# Patient Record
Sex: Male | Born: 1995
Health system: Southern US, Community
[De-identification: ages and names within clinical notes are randomized; demographics above are authoritative.]

## PROBLEM LIST (undated history)

## (undated) ENCOUNTER — Emergency Department: Discharge status: Absent without leave | Payer: Self-pay

## (undated) DIAGNOSIS — L600 Ingrowing nail: Secondary | ICD-10-CM

## (undated) DIAGNOSIS — F329 Major depressive disorder, single episode, unspecified: Secondary | ICD-10-CM

## (undated) DIAGNOSIS — F32A Depression, unspecified: Secondary | ICD-10-CM

## (undated) HISTORY — DX: Depression, unspecified: F32.A

## (undated) HISTORY — DX: Major depressive disorder, single episode, unspecified: F32.9

---
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
## 2014-05-20 ENCOUNTER — Ambulatory Visit (INDEPENDENT_AMBULATORY_CARE_PROVIDER_SITE_OTHER): Payer: 59 | Admitting: Internal Medicine

## 2014-05-20 ENCOUNTER — Encounter: Payer: Self-pay | Admitting: Internal Medicine

## 2014-05-20 VITALS — BP 100/70 | HR 96 | Temp 98.0°F | Resp 18 | Ht 63.0 in | Wt 127.0 lb

## 2014-05-20 DIAGNOSIS — L237 Allergic contact dermatitis due to plants, except food: Secondary | ICD-10-CM

## 2014-05-20 DIAGNOSIS — L255 Unspecified contact dermatitis due to plants, except food: Secondary | ICD-10-CM

## 2014-05-20 MED ORDER — PREDNISONE 10 MG PO TABS: ORAL_TABLET | ORAL | Status: DC

## 2014-05-20 MED ORDER — TRIAMCINOLONE ACETONIDE 0.1 % EX CREA: 1.0000 "application " | TOPICAL_CREAM | Freq: Two times a day (BID) | CUTANEOUS | Status: DC

## 2014-05-20 NOTE — Progress Notes (Signed)
   Subjective:    Patient ID: Scott Zimmerman, male    DOB: 07/22/1996, 18 y.o.   MRN: [id number]  HPI  18 year old patient who presents with a one-week history of a very pruritic rash involving primarily the extremities due to a poison ivy/contact dermatitis.  History reviewed. No pertinent past medical history.  History   Social History  . Marital Status: Single    Spouse Name: N/A    Number of Children: N/A  . Years of Education: N/A   Occupational History  . Not on file.   Social History Main Topics  . Smoking status: Never Smoker   . Smokeless tobacco: Never Used  . Alcohol Use: 2.0 oz/week    4 drink(s) per week  . Drug Use: No  . Sexual Activity: Not on file   Other Topics Concern  . Not on file   Social History Narrative  . No narrative on file    History reviewed. No pertinent past surgical history.  No family history on file.  No Known Allergies  No current outpatient prescriptions on file prior to visit.   No current facility-administered medications on file prior to visit.    BP 100/70  Pulse 96  Temp(Src) 98 F (36.7 C) (Oral)  Resp 18  Ht 5\' 3"  (1.6 m)  Wt 127 lb (57.607 kg)  BMI 22.50 kg/m2  SpO2 97%     Review of Systems  Constitutional: Negative for fever, chills, appetite change and fatigue.  HENT: Negative for congestion, dental problem, ear pain, hearing loss, sore throat, tinnitus, trouble swallowing and voice change.   Eyes: Negative for pain, discharge and visual disturbance.  Respiratory: Negative for cough, chest tightness, wheezing and stridor.   Cardiovascular: Negative for chest pain, palpitations and leg swelling.  Gastrointestinal: Negative for nausea, vomiting, abdominal pain, diarrhea, constipation, blood in stool and abdominal distention.  Genitourinary: Negative for urgency, hematuria, flank pain, discharge, difficulty urinating and genital sores.  Musculoskeletal: Negative for arthralgias, back pain, gait problem,  joint swelling, myalgias and neck stiffness.  Skin: Positive for rash.  Neurological: Negative for dizziness, syncope, speech difficulty, weakness, numbness and headaches.  Hematological: Negative for adenopathy. Does not bruise/bleed easily.  Psychiatric/Behavioral: Negative for behavioral problems and dysphoric mood. The patient is not nervous/anxious.        Objective:   Physical Exam  Constitutional: He appears well-developed and well-nourished. No distress.  Skin:  Scattered dry, erythematous, patchy rash over primarily inner aspects of the arms, but also affecting the right axilla and lower extremities          Assessment & Plan:   Poison ivy dermatitis.  Will treat with topical and oral steroids  We'll call if unimproved

## 2014-05-20 NOTE — Progress Notes (Signed)
Pre visit review using our clinic review tool, if applicable. No additional management support is needed unless otherwise documented below in the visit note. 

## 2014-05-20 NOTE — Patient Instructions (Signed)
       Poison Ivy Poison ivy is a inflammation of the skin (contact dermatitis) caused by touching the allergens on the leaves of the ivy plant following previous exposure to the plant. The rash usually appears 48 hours after exposure. The rash is usually bumps (papules) or blisters (vesicles) in a linear pattern. Depending on your own sensitivity, the rash may simply cause redness and itching, or it may also progress to blisters which may break open. These must be well cared for to prevent secondary bacterial (germ) infection, followed by scarring. Keep any open areas dry, clean, dressed, and covered with an antibacterial ointment if needed. The eyes may also get puffy. The puffiness is worst in the morning and gets better as the day progresses. This dermatitis usually heals without scarring, within 2 to 3 weeks without treatment. HOME CARE INSTRUCTIONS  Thoroughly wash with soap and water as soon as you have been exposed to poison ivy. You have about one half hour to remove the plant resin before it will cause the rash. This washing will destroy the oil or antigen on the skin that is causing, or will cause, the rash. Be sure to wash under your fingernails as any plant resin there will continue to spread the rash. Do not rub skin vigorously when washing affected area. Poison ivy cannot spread if no oil from the plant remains on your body. A rash that has progressed to weeping sores will not spread the rash unless you have not washed thoroughly. It is also important to wash any clothes you have been wearing as these may carry active allergens. The rash will return if you wear the unwashed clothing, even several days later. Avoidance of the plant in the future is the best measure. Poison ivy plant can be recognized by the number of leaves. Generally, poison ivy has three leaves with flowering branches on a single stem. Diphenhydramine may be purchased over the counter and used as needed for itching. Do not  drive with this medication if it makes you drowsy.Ask your caregiver about medication for children. SEEK MEDICAL CARE IF:  Open sores develop.  Redness spreads beyond area of rash.  You notice purulent (pus-like) discharge.  You have increased pain.  Other signs of infection develop (such as fever). Document Released: 10/15/2000 Document Revised: 01/10/2012 Document Reviewed: 09/03/2009 ExitCare Patient Information 2015 [employer]. This information is not intended to replace advice given to you by your health care provider. Make sure you discuss any questions you have with your health care provider.  

## 2014-05-21 ENCOUNTER — Telehealth: Payer: Self-pay | Admitting: Internal Medicine

## 2014-05-21 MED ORDER — PREDNISONE 10 MG PO TABS: ORAL_TABLET | ORAL | Status: DC

## 2014-05-21 NOTE — Telephone Encounter (Signed)
Spoke to pt told him Dr. Raliegh Ip gave him the Rx for Prednisone to take to the pharmacy to fill. Asked pt if he has it? Pt looked and found Rx. Told him okay take to the pharmacy and they will fill it. Pt verbalized understanding.

## 2014-05-21 NOTE — Telephone Encounter (Signed)
Pt was seen yesterday, pt states the rx predniSONE (DELTASONE) 10 MG tablet was not at the pharmacy when he went to pick up the meds. Pt states he received the cream but not the prednisone, please send to [place] [facility] Pt needs asap states he is miserable.

## 2014-06-03 ENCOUNTER — Ambulatory Visit: Payer: 59 | Admitting: Internal Medicine

## 2014-06-20 ENCOUNTER — Ambulatory Visit: Payer: Self-pay | Admitting: Internal Medicine

## 2014-06-20 DIAGNOSIS — Z0289 Encounter for other administrative examinations: Secondary | ICD-10-CM

## 2014-10-21 ENCOUNTER — Ambulatory Visit: Payer: Self-pay | Admitting: Internal Medicine

## 2015-04-07 ENCOUNTER — Encounter: Payer: Self-pay | Admitting: Internal Medicine

## 2015-04-07 ENCOUNTER — Ambulatory Visit (INDEPENDENT_AMBULATORY_CARE_PROVIDER_SITE_OTHER): Payer: 59 | Admitting: Internal Medicine

## 2015-04-07 VITALS — BP 122/78 | HR 79 | Temp 98.4°F | Resp 18 | Ht 63.0 in | Wt 135.0 lb

## 2015-04-07 DIAGNOSIS — L237 Allergic contact dermatitis due to plants, except food: Secondary | ICD-10-CM | POA: Diagnosis not present

## 2015-04-07 MED ORDER — PREDNISONE 10 MG PO TABS: ORAL_TABLET | ORAL | Status: DC

## 2015-04-07 MED ORDER — TRIAMCINOLONE ACETONIDE 0.1 % EX CREA: 1.0000 "application " | TOPICAL_CREAM | Freq: Two times a day (BID) | CUTANEOUS | Status: DC

## 2015-04-07 NOTE — Progress Notes (Signed)
Pre visit review using our clinic review tool, if applicable. No additional management support is needed unless otherwise documented below in the visit note. 

## 2015-04-07 NOTE — Progress Notes (Signed)
   Subjective:    Patient ID: Scott Zimmerman, male    DOB: 1996-08-31, 19 y.o.   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  He was last seen last summer for a contact dermatitis.  The past several days he has had a spreading very erythematous rash involving primarily the extremities.  The lesions began as a blister and are quite pruritic.  Last year he was treated with oral prednisone and did quite well.  Review of Systems  Skin: Positive for rash.       Objective:   Physical Exam  Constitutional: He appears well-developed and well-nourished. No distress.  Skin: Rash noted.  Scattered excoriations and erythematous papules over the arms and legs          Assessment & Plan:   Content dermatitis.  Will treat with oral prednisone Prescription for topical cream.  Also dispensed

## 2015-04-07 NOTE — Patient Instructions (Signed)

## 2015-04-22 ENCOUNTER — Encounter: Payer: Self-pay | Admitting: Internal Medicine

## 2015-04-22 ENCOUNTER — Ambulatory Visit (INDEPENDENT_AMBULATORY_CARE_PROVIDER_SITE_OTHER): Payer: 59 | Admitting: Internal Medicine

## 2015-04-22 VITALS — BP 120/70 | HR 90 | Temp 98.0°F | Resp 20 | Ht 62.5 in | Wt 138.0 lb

## 2015-04-22 DIAGNOSIS — Z0000 Encounter for general adult medical examination without abnormal findings: Secondary | ICD-10-CM | POA: Diagnosis not present

## 2015-04-22 NOTE — Progress Notes (Signed)
Pre visit review using our clinic review tool, if applicable. No additional management support is needed unless otherwise documented below in the visit note. 

## 2015-04-22 NOTE — Progress Notes (Signed)
   Subjective:    Patient ID: Scott Zimmerman, male    DOB: January 21, 1996, 19 y.o.   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  Both his parents are patients here.  Past medical history is unremarkable; was treated by his pediatrician for depression.  Briefly  Social history- one year at [employer] and will be transferring to [place]  in the fall Drinks 3-4 times per week; smokes 2 packs of cigarettes per week; occasional marijuana user  Family history father and grandmother have diabetes.  Otherwise unremarkable.  One brother in good health  Review of Systems  Constitutional: Negative for fever, chills, appetite change and fatigue.  HENT: Negative for congestion, dental problem, ear pain, hearing loss, sore throat, tinnitus, trouble swallowing and voice change.   Eyes: Negative for pain, discharge and visual disturbance.  Respiratory: Negative for cough, chest tightness, wheezing and stridor.   Cardiovascular: Negative for chest pain, palpitations and leg swelling.  Gastrointestinal: Negative for nausea, vomiting, abdominal pain, diarrhea, constipation, blood in stool and abdominal distention.  Genitourinary: Negative for urgency, hematuria, flank pain, discharge, difficulty urinating and genital sores.  Musculoskeletal: Negative for myalgias, back pain, joint swelling, arthralgias, gait problem and neck stiffness.  Skin: Negative for rash.  Neurological: Negative for dizziness, syncope, speech difficulty, weakness, numbness and headaches.  Hematological: Negative for adenopathy. Does not bruise/bleed easily.  Psychiatric/Behavioral: Negative for behavioral problems and dysphoric mood. The patient is not nervous/anxious.        Objective:   Physical Exam  Constitutional: He appears well-developed and well-nourished.  HENT:  Head: Normocephalic and atraumatic.  Right Ear: External ear normal.  Left Ear: External ear normal.    Nose: Nose normal.  Mouth/Throat: Oropharynx is clear and moist.  Eyes: Conjunctivae and EOM are normal. Pupils are equal, round, and reactive to light. No scleral icterus.  Neck: Normal range of motion. Neck supple. No JVD present. No thyromegaly present.  Cardiovascular: Regular rhythm, normal heart sounds and intact distal pulses.  Exam reveals no gallop and no friction rub.   No murmur heard. Pulmonary/Chest: Effort normal and breath sounds normal. He exhibits no tenderness.  Abdominal: Soft. Bowel sounds are normal. He exhibits no distension and no mass. There is no tenderness.  Genitourinary: Penis normal.  Musculoskeletal: Normal range of motion. He exhibits no edema or tenderness.  Lymphadenopathy:    He has no cervical adenopathy.  Neurological: He is alert. He has normal reflexes. No cranial nerve deficit. Coordination normal.  Skin: Skin is warm and dry. No rash noted.  Psychiatric: He has a normal mood and affect. His behavior is normal.          Assessment & Plan:   Preventive health examination.  Cessation of smoking.  Encouraged was asked to moderate alcohol use   Return as needed

## 2015-04-22 NOTE — Patient Instructions (Signed)
Smoking tobacco is very bad for your health. You should stop smoking immediately.  Health Maintenance - 72-20 Years Old SCHOOL PERFORMANCE After high school, you may attend college or technical or vocational school, enroll in the [employer], or enter the workforce. PHYSICAL, SOCIAL, AND EMOTIONAL DEVELOPMENT  One hour of regular physical activity daily is recommended. Continue to participate in sports.  Develop your own interests and consider community service or volunteerism.  Make decisions about college and work plans.  Throughout these years, you should assume responsibility for your own health care. Increasing independence is important for you.  You may be exploring your sexual identity. Understand that you should never be in a situation that makes you feel uncomfortable, and tell your partner if you do not want to engage in sexual activity.  Body image may become important to you. Be mindful that eating disorders can develop at this time. Talk to your parents or other caregivers if you have concerns about body image, weight gain, or losing weight.  You may notice mood disturbances, depression, anxiety, attention problems, or trouble with alcohol. Talk to your health care provider if you have concerns about mental illness.  Set limits for yourself and talk with your parents or other caregivers about independent decision making.  Handle conflict without physical violence.  Avoid loud noises which may impair hearing.  Limit television and computer time to 2 hours each day. Individuals who engage in excessive inactivity are more likely to become overweight. RECOMMENDED IMMUNIZATIONS  Influenza vaccine.  All adults should be immunized every year.  All adults, including pregnant women and people with hives-only allergy to eggs, can receive the inactivated influenza (IIV) vaccine.  Adults aged 18-49 years can receive the recombinant influenza (RIV) vaccine. The RIV vaccine does not  contain any egg protein.  Tetanus, diphtheria, and acellular pertussis (Td, Tdap) vaccine.  Pregnant women should receive 1 dose of Tdap vaccine during each pregnancy. The dose should be obtained regardless of the length of time since the last dose. Immunization is preferred during the 27th to 36th week of gestation.  An adult who has not previously received Tdap or who does not know his or her vaccine status should receive 1 dose of Tdap. This initial dose should be followed by tetanus and diphtheria toxoids (Td) booster doses every 10 years.  Adults with an unknown or incomplete history of completing a 3-dose immunization series with Td-containing vaccines should begin or complete a primary immunization series including a Tdap dose.  Adults should receive a Td booster every 10 years.  Varicella vaccine.  An adult without evidence of immunity to varicella should receive 2 doses or a second dose if he or she has previously received 1 dose.  Pregnant females who do not have evidence of immunity should receive the first dose after pregnancy. This first dose should be obtained before leaving the health care facility. The second dose should be obtained 4-8 weeks after the first dose.  Human papillomavirus (HPV) vaccine.  Females aged 13-26 years who have not received the vaccine previously should obtain the 3-dose series.  The vaccine is not recommended for pregnant females. However, pregnancy testing is not needed before receiving a dose. If a male is found to be pregnant after receiving a dose, no treatment is needed. In that case, the remaining doses should be delayed until after the pregnancy.  Males aged 40-21 years who have not received the vaccine previously should receive the 3-dose series. Males aged 22-26 years may  be immunized.  Immunization is recommended through the age of 8 years for any male who has sex with males and did not get any or all doses earlier.  Immunization is  recommended for any person with an immunocompromised condition through the age of 73 years if he or she did not get any or all doses earlier.  During the 3-dose series, the second dose should be obtained 4-8 weeks after the first dose. The third dose should be obtained 24 weeks after the first dose and 16 weeks after the second dose.  Measles, mumps, and rubella (MMR) vaccine.  Adults born in [birth year] or later should have 1 or more doses of MMR vaccine unless there is a contraindication to the vaccine or there is laboratory evidence of immunity to each of the three diseases.  A routine second dose of MMR vaccine should be obtained at least 28 days after the first dose for students attending postsecondary schools, health care workers, and international travelers.  For females of childbearing age, rubella immunity should be determined. If there is no evidence of immunity, females who are not pregnant should be vaccinated. If there is no evidence of immunity, females who are pregnant should delay immunization until after pregnancy.  Pneumococcal 13-valent conjugate (PCV13) vaccine.  When indicated, a person who is uncertain of his or her immunization history and has no record of immunization should receive the PCV13 vaccine.  An adult aged 43 years or older who has certain medical conditions and has not been previously immunized should receive 1 dose of PCV13 vaccine. This PCV13 should be followed with a dose of pneumococcal polysaccharide (PPSV23) vaccine. The PPSV23 vaccine dose should be obtained at least 8 weeks after the dose of PCV13 vaccine.  An adult aged 22 years or older who has certain medical conditions and previously received 1 or more doses of PPSV23 vaccine should receive 1 dose of PCV13. The PCV13 vaccine dose should be obtained 1 or more years after the last PPSV23 vaccine dose.  Pneumococcal polysaccharide (PPSV23) vaccine.  When PCV13 is also indicated, PCV13 should be obtained  first.  An adult younger than age 58 years who has certain medical conditions should be immunized.  Any person who resides in a long-term care facility should be immunized.  An adult smoker should be immunized.  People with an immunocompromised condition and certain other conditions should receive both PCV13 and PPSV23 vaccines.  People with human immunodeficiency virus (HIV) infection should be immunized as soon as possible after diagnosis.  Immunization during chemotherapy or radiation therapy should be avoided.  Routine use of PPSV23 vaccine is not recommended for American Indians, [place] Natives, or people younger than 65 years unless there are medical conditions that require PPSV23 vaccine.  When indicated, people who have unknown immunization and have no record of immunization should receive PPSV23 vaccine.  One-time revaccination 5 years after the first dose of PPSV23 is recommended for people aged 19-64 years who have chronic kidney failure, nephrotic syndrome, asplenia, or immunocompromised conditions.  Meningococcal vaccine.  Adults with asplenia or persistent complement component deficiencies should receive 2 doses of quadrivalent meningococcal conjugate (MenACWY-D) vaccine. The doses should be obtained at least 2 months apart.  Microbiologists working with certain meningococcal bacteria, [place] recruits, people at risk during an outbreak, and people who travel to or live in countries with a high rate of meningitis should be immunized.  A first-year college student up through age 57 years who is living in a residence hall  should receive a dose if he or she did not receive a dose on or after his or her 16th birthday.  Adults who have certain high-risk conditions should receive one or more doses of vaccine.  Hepatitis A vaccine.  Adults who wish to be protected from this disease, have certain high-risk conditions, work with hepatitis A-infected animals, work in hepatitis A  research labs, or travel to or work in countries with a high rate of hepatitis A should be immunized.  Adults who were previously unvaccinated and who anticipate close contact with an international adoptee during the first 60 days after arrival in the [place] from a country with a high rate of hepatitis A should be immunized.  Hepatitis B vaccine.  Adults who wish to be protected from this disease, have certain high-risk conditions, may be exposed to blood or other infectious body fluids, are household contacts or sex partners of hepatitis B positive people, are clients or workers in certain care facilities, or travel to or work in countries with a high rate of hepatitis B should be immunized.  Haemophilus influenzae type b (Hib) vaccine.  A previously unvaccinated person with asplenia or sickle cell disease or having a scheduled splenectomy should receive 1 dose of Hib vaccine.  Regardless of previous immunization, a recipient of a hematopoietic stem cell transplant should receive a 3-dose series 6-12 months after his or her successful transplant.  Hib vaccine is not recommended for adults with HIV infection. TESTING  Annual screening for vision and hearing problems is recommended. Vision should be screened at least once between 68-76 years of age.  You may be screened for anemia or tuberculosis.  You should have a blood test to check for high cholesterol.  You should be screened for alcohol and drug use.  If you are sexually active, you may be screened for sexually transmitted infections (STIs), pregnancy, or HIV. You should be screened for STIs if:  Your sexual activity has changed since the last screening test, and you are at an increased risk for chlamydia or gonorrhea. Ask your health care provider if you are at risk.  If you are at an increased risk for hepatitis B, you should be screened for this virus. You are considered at high risk for hepatitis B if you:  Were born in  a country where hepatitis B occurs often. Talk with your health care provider about which countries are considered high risk.  Have parents who were born in a high-risk country and have not received a shot to protect against hepatitis B (hepatitis B vaccine).  Have HIV or AIDS.  Use needles to inject street drugs.  Live with or have sex with someone who has hepatitis B.  Are a man who has sex with other men (MSM).  Get hemodialysis treatment.  Take certain medicines for conditions like cancer, organ transplantation, or autoimmune conditions. NUTRITION   You should:  Have three servings of low-fat milk and dairy products daily. If you do not drink milk or consume dairy products, you should eat calcium-enriched foods, such as juice, bread, or cereal. Dark, leafy greens or canned fish are alternate sources of calcium.  Drink plenty of water. Fruit juice should be limited to 8-12 oz (240-360 mL) each day. Sugary beverages and sodas should be avoided.  Avoid eating foods high in fat, salt, or sugar, such as chips, candy, and cookies.  Avoid fast foods and limit eating out at restaurants.  Try not to skip meals, especially breakfast.  You should eat a variety of vegetables, fruits, and lean meats.  Eat meals together as a family whenever possible. ORAL HEALTH Brush your teeth twice a day and floss at least once a day. You should have two dental exams a year.  SKIN CARE You should wear sunscreen when out in the sun. TALK TO SOMEONE ABOUT:  Precautions against pregnancy, contraception, and sexually transmitted infections.  Taking a prescription medicine daily to prevent HIV infection if you are at risk of being infected with HIV. This is called preexposure prophylaxis (PrEP). You are at risk if you:  Are a male who has sex with other males (MSM).  Are heterosexual and sexually active with more than one partner.  Take drugs by injection.  Are sexually active with a partner who has  HIV.  Whether you are at high risk of being infected with HIV. If you choose to begin PrEP, you should first be tested for HIV. You should then be tested every 3 months for as long as you are taking PrEP.  Drug, tobacco, and alcohol use among your friends or at friends' homes. Smoking tobacco or marijuana and taking drugs have health consequences and may impact your brain development.  Appropriate use of over-the-counter or prescription medicines.  Driving guidelines and riding with friends.  The risks of drinking and driving or boating. Call someone if you have been drinking or using drugs and need a ride. WHAT'S NEXT? Visit your pediatrician or family physician once a year. By young adulthood, you should transition from your pediatrician to a family physician or internal medicine specialist. If you are a male and are sexually active, you may want to begin annual physical exams with a gynecologist. Document Released: 01/13/2007 Document Revised: 10/23/2013 Document Reviewed: 02/02/2007 [facility] Patient Information 2015 [place], [place]. This information is not intended to replace advice given to you by your health care provider. Make sure you discuss any questions you have with your health care provider.

## 2019-06-08 ENCOUNTER — Other Ambulatory Visit: Payer: Self-pay

## 2019-06-08 ENCOUNTER — Encounter: Payer: Self-pay | Admitting: Family Medicine

## 2019-06-08 ENCOUNTER — Ambulatory Visit: Payer: 59 | Admitting: Family Medicine

## 2019-06-08 NOTE — Progress Notes (Signed)
Virtual Visit via Video Note Patient late cancelled appointment after note was started.

## 2019-06-14 ENCOUNTER — Ambulatory Visit (INDEPENDENT_AMBULATORY_CARE_PROVIDER_SITE_OTHER): Payer: Self-pay | Admitting: Family Medicine

## 2019-06-14 ENCOUNTER — Other Ambulatory Visit: Payer: Self-pay

## 2019-06-14 DIAGNOSIS — R21 Rash and other nonspecific skin eruption: Secondary | ICD-10-CM

## 2019-06-14 MED ORDER — PREDNISONE 10 MG PO TABS
ORAL_TABLET | ORAL | 0 refills | Status: DC
Start: 2019-06-14 — End: 2019-07-13

## 2019-06-14 NOTE — Progress Notes (Signed)
Virtual Visit via Video Note  I connected with Scott Zimmerman  on 06/14/19 at  4:20 PM EDT by a video enabled telemedicine application and verified that I am speaking with the correct person using two identifiers.  Location patient: home Location provider:work or home office Persons participating in the virtual visit: patient, provider    HPI:  Acute visit for poison ivy: -started yesterday after pulling bushes out and was exposed to poison ivy -he is allergic to poison ivy -now with itchy papulovesicular rash on arms, legs, trunk, neck and a few small patches on face above R eye and near L ear -denies SOB, lesions in eyes/noes/mouth -reports has had this in the past and tolerated prednisone treatment well  ROS: See pertinent positives and negatives per HPI.  Past Medical History:  Diagnosis Date  . Depression   . INGROWN TOENAIL, INFECTED 09/02/2010   Qualifier: Diagnosis of  By: Assunta Found MD, Annie Main      No past surgical history on file.  Family History  Problem Relation Age of Onset  . Diabetes Father        Insulin Dependent  . Diabetes Paternal Grandmother     SOCIAL HX: see hpi   Current Outpatient Medications:  .  predniSONE (DELTASONE) 10 MG tablet, 5 tablets (50mg ) daily for 3 days, then 4 tablets (40 mg) daily for 3 days, then 3 tablets (30mg ) daily for 3 days, then 2 tablets (20 mg) daily for 3 days, then 1 tablet (10 mg) daily for 3 days., Disp: 45 tablet, Rfl: 0 .  triamcinolone cream (KENALOG) 0.1 %, Apply 1 application topically 2 (two) times daily., Disp: 30 g, Rfl: 0  EXAM:  VITALS per patient if applicable:  GENERAL: alert, oriented, appears well and in no acute distress  HEENT: atraumatic, conjunttiva clear, no obvious abnormalities on inspection of external nose and ears  NECK: normal movements of the head and neck  LUNGS: on inspection no signs of respiratory distress, breathing rate appears normal, no obvious gross SOB, gasping or wheezing  CV: no  obvious cyanosis  MS: moves all visible extremities without noticeable abnormality  SKIN: was able to exam lesions on the arms/face - erythematous papulovesicular rash in streaky distribution on both arms, above R eye   PSYCH/NEURO: pleasant and cooperative, no obvious depression or anxiety, speech and thought processing grossly intact  ASSESSMENT AND PLAN:  Discussed the following assessment and plan:  Rash   -we discussed possible serious and likely etiologies, treatment, treatment risks and return precautions. Suspect Toxicodendron dermatitis. -after this discussion, [name] opted for treatment with prednisone taper -follow up advised as needed if symptoms worsen or persist or new concerns arise.     [name], DO    Patient Instructions   -I sent the medication we discussed to the pharmacy: Meds ordered this encounter  Medications  . predniSONE (DELTASONE) 10 MG tablet    Sig: 5 tablets (50mg ) daily for 3 days, then 4 tablets (40 mg) daily for 3 days, then 3 tablets (30mg ) daily for 3 days, then 2 tablets (20 mg) daily for 3 days, then 1 tablet (10 mg) daily for 3 days.    Dispense:  45 tablet    Refill:  0   Please let us know if you have any questions or concerns regarding this prescription.  I hope you are feeling better soon! Seek care promptly if your symptoms worsen, new concerns arise or you are not improving with treatment.   Poison Ivy Dermatitis  Poison ivy dermatitis is redness and soreness of the skin caused by chemicals in the leaves of the poison ivy plant. You may have very bad itching, swelling, a rash, and blisters. What are the causes?  Touching a poison ivy plant.  Touching something that has the chemical on it. This may include animals or objects that have come in contact with the plant. What increases the risk?  Going outdoors often in wooded or [place] areas.  Going outdoors without wearing protective clothing, such as closed shoes, long  pants, and a long-sleeved shirt. What are the signs or symptoms?   Skin redness.  Very bad itching.  A rash that often includes bumps and blisters. ? The rash usually appears 48 hours after exposure, if you have been exposed before. ? If this is the first time you have been exposed, the rash may not appear until a week after exposure.  Swelling. This may occur if the reaction is very bad. Symptoms usually last for 1-2 weeks. The first time you develop this condition, symptoms may last 3-4 weeks. How is this treated? This condition may be treated with:  Hydrocortisone cream or calamine lotion to relieve itching.  Oatmeal baths to soothe the skin.  Medicines, such as over-the-counter antihistamine tablets.  Oral steroid medicine for more severe reactions. Follow these instructions at home: Medicines  Take or apply over-the-counter and prescription medicines only as told by your doctor.  Use hydrocortisone cream or calamine lotion as needed to help with itching. General instructions  Do not scratch or rub your skin.  Put a cold, wet cloth (cold compress) on the affected areas or take baths in cool water. This will help with itching.  Avoid hot baths and showers.  Take oatmeal baths as needed. Use colloidal oatmeal. You can get this at a pharmacy or grocery store. Follow the instructions on the package.  While you have the rash, wash your clothes right after you wear them.  Keep all follow-up visits as told by your health care provider. This is important. How is this prevented?   Know what poison ivy looks like, so you can avoid it. ? This plant has three leaves with flowering branches on a single stem. ? The leaves are glossy. ? The leaves have uneven edges that come to a point at the front.  If you touch poison ivy, wash your skin with soap and water right away. Be sure to wash under your fingernails.  When hiking or camping, wear long pants, a long-sleeved shirt,  tall socks, and hiking boots. You can also use a lotion on your skin that helps to prevent contact with poison ivy.  If you think that your clothes or outdoor gear came in contact with poison ivy, rinse them off with a garden hose before you bring them inside your house.  When doing yard work or gardening, wear gloves, long sleeves, long pants, and boots. Wash your garden tools and gloves if they come in contact with poison ivy.  If you think that your pet has come into contact with poison ivy, wash him or her with pet shampoo and water. Make sure to wear gloves while washing your pet. Contact a doctor if:  You have open sores in the rash area.  You have more redness, swelling, or pain in the rash area.  You have redness that spreads beyond the rash area.  You have fluid, blood, or pus coming from the rash area.  You have a fever.  You have a rash over a large area of your body.  You have a rash on your eyes, mouth, or genitals.  Your rash does not get better after a few weeks. Get help right away if:  Your face swells or your eyes swell shut.  You have trouble breathing.  You have trouble swallowing. These symptoms may be an emergency. Do not wait to see if the symptoms will go away. Get medical help right away. Call your local emergency services (911 in the [place] Do not drive yourself to the hospital. Summary  Poison ivy dermatitis is redness and soreness of the skin caused by chemicals in the leaves of the poison ivy plant.  You may have skin redness, very bad itching, swelling, and a rash.  Do not scratch or rub your skin.  Take or apply over-the-counter and prescription medicines only as told by your doctor. This information is not intended to replace advice given to you by your health care provider. Make sure you discuss any questions you have with your health care provider. Document Released: 11/20/2010 Document Revised: 02/09/2019 Document Reviewed: 10/13/2018  Elsevier Patient Education  2020 [employer].

## 2019-06-14 NOTE — Patient Instructions (Signed)
-I sent the medication we discussed to the pharmacy: Meds ordered this encounter  Medications  . predniSONE (DELTASONE) 10 MG tablet    Sig: 5 tablets (50mg ) daily for 3 days, then 4 tablets (40 mg) daily for 3 days, then 3 tablets (30mg ) daily for 3 days, then 2 tablets (20 mg) daily for 3 days, then 1 tablet (10 mg) daily for 3 days.    Dispense:  45 tablet    Refill:  0   Please let us know if you have any questions or concerns regarding this prescription.  I hope you are feeling better soon! Seek care promptly if your symptoms worsen, new concerns arise or you are not improving with treatment.   Poison Ivy Dermatitis Poison ivy dermatitis is redness and soreness of the skin caused by chemicals in the leaves of the poison ivy plant. You may have very bad itching, swelling, a rash, and blisters. What are the causes?  Touching a poison ivy plant.  Touching something that has the chemical on it. This may include animals or objects that have come in contact with the plant. What increases the risk?  Going outdoors often in wooded or [place] areas.  Going outdoors without wearing protective clothing, such as closed shoes, long pants, and a long-sleeved shirt. What are the signs or symptoms?   Skin redness.  Very bad itching.  A rash that often includes bumps and blisters. ? The rash usually appears 48 hours after exposure, if you have been exposed before. ? If this is the first time you have been exposed, the rash may not appear until a week after exposure.  Swelling. This may occur if the reaction is very bad. Symptoms usually last for 1-2 weeks. The first time you develop this condition, symptoms may last 3-4 weeks. How is this treated? This condition may be treated with:  Hydrocortisone cream or calamine lotion to relieve itching.  Oatmeal baths to soothe the skin.  Medicines, such as over-the-counter antihistamine tablets.  Oral steroid medicine for more severe  reactions. Follow these instructions at home: Medicines  Take or apply over-the-counter and prescription medicines only as told by your doctor.  Use hydrocortisone cream or calamine lotion as needed to help with itching. General instructions  Do not scratch or rub your skin.  Put a cold, wet cloth (cold compress) on the affected areas or take baths in cool water. This will help with itching.  Avoid hot baths and showers.  Take oatmeal baths as needed. Use colloidal oatmeal. You can get this at a pharmacy or grocery store. Follow the instructions on the package.  While you have the rash, wash your clothes right after you wear them.  Keep all follow-up visits as told by your health care provider. This is important. How is this prevented?   Know what poison ivy looks like, so you can avoid it. ? This plant has three leaves with flowering branches on a single stem. ? The leaves are glossy. ? The leaves have uneven edges that come to a point at the front.  If you touch poison ivy, wash your skin with soap and water right away. Be sure to wash under your fingernails.  When hiking or camping, wear long pants, a long-sleeved shirt, tall socks, and hiking boots. You can also use a lotion on your skin that helps to prevent contact with poison ivy.  If you think that your clothes or outdoor gear came in contact with poison ivy, rinse them  off with a garden hose before you bring them inside your house.  When doing yard work or gardening, wear gloves, long sleeves, long pants, and boots. Wash your garden tools and gloves if they come in contact with poison ivy.  If you think that your pet has come into contact with poison ivy, wash him or her with pet shampoo and water. Make sure to wear gloves while washing your pet. Contact a doctor if:  You have open sores in the rash area.  You have more redness, swelling, or pain in the rash area.  You have redness that spreads beyond the rash  area.  You have fluid, blood, or pus coming from the rash area.  You have a fever.  You have a rash over a large area of your body.  You have a rash on your eyes, mouth, or genitals.  Your rash does not get better after a few weeks. Get help right away if:  Your face swells or your eyes swell shut.  You have trouble breathing.  You have trouble swallowing. These symptoms may be an emergency. Do not wait to see if the symptoms will go away. Get medical help right away. Call your local emergency services (911 in the [place] Do not drive yourself to the hospital. Summary  Poison ivy dermatitis is redness and soreness of the skin caused by chemicals in the leaves of the poison ivy plant.  You may have skin redness, very bad itching, swelling, and a rash.  Do not scratch or rub your skin.  Take or apply over-the-counter and prescription medicines only as told by your doctor. This information is not intended to replace advice given to you by your health care provider. Make sure you discuss any questions you have with your health care provider. Document Released: 11/20/2010 Document Revised: 02/09/2019 Document Reviewed: 10/13/2018 Elsevier Patient Education  2020 [employer].

## 2019-07-13 ENCOUNTER — Encounter: Payer: Self-pay | Admitting: Family Medicine

## 2019-07-13 ENCOUNTER — Other Ambulatory Visit: Payer: Self-pay

## 2019-07-13 ENCOUNTER — Ambulatory Visit (INDEPENDENT_AMBULATORY_CARE_PROVIDER_SITE_OTHER): Payer: Commercial Managed Care - PPO | Admitting: Family Medicine

## 2019-07-13 DIAGNOSIS — N50811 Right testicular pain: Secondary | ICD-10-CM

## 2019-07-13 NOTE — Progress Notes (Signed)
Virtual Visit via Video Note  I connected with Scott Zimmerman  on 07/13/19 at  2:00 PM EDT by a video enabled telemedicine application and verified that I am speaking with the correct person using two identifiers.  Location patient: home Location provider:work office Persons participating in the virtual visit: patient, provider  I discussed the limitations of evaluation and management by telemedicine and the availability of in person appointments. The patient expressed understanding and agreed to proceed.   Scott Zimmerman DOB: 04-29-1996 Encounter date: 07/13/2019  This is a 23 y.o. male who presents to establish care. Chief Complaint  Patient presents with  . Establish Care    History of present illness: Starting at end of spring and into summer once a month gets a dull pain in right testicle for 1-2 days. Just enough to notice and bother him in sleep. No swelling or visual change. No discoloration. Seems to come out of blue - not associated with ejaculation or urination. Everything "works fine" when pain is occurring without anything that worsens pain. Pain stays in testicle. No radiation up into groin. States maybe back might feel something but not sure. No pain up under scrotum. No penile pain. Gets a little tender when he has this sensation. When occurring rates pain as 3-4/10.   Trying to stay safe with COVID.   Remote hx of depression - senior year high school. Oct-Feb (2014-5). Was on medication tx for some time but stopped because he felt like it was hindering him. Was able to do activities which helped pull him out of there. Graduated college Dec 2019. Majored in Conservation officer, nature and minored in Estate agent. 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   Goes running regularly.   Follows with dentist regularly.  Hasn't had regular eye checks; no noted change in vision.   Past Medical History:  Diagnosis Date  . Depression   . INGROWN TOENAIL, INFECTED 09/02/2010   Qualifier: Diagnosis of  By: Assunta Found MD, Annie Main     Past Surgical History:  Procedure Laterality Date  . WISDOM TOOTH EXTRACTION  2014   No Known Allergies Current Meds  Medication Sig  . triamcinolone cream (KENALOG) 0.5 % Apply 1 application topically as needed.   Social History   Tobacco Use  . Smoking status: Former Smoker    Types: Cigarettes    Quit date: 11/01/2016    Years since quitting: 2.6  . Smokeless tobacco: Never Used  Substance Use Topics  . Alcohol use: Yes    Alcohol/week: 18.0 standard drinks    Types: 18 Cans of beer per week    Comment: sometimes more   Family History  Problem Relation Age of Onset  . Asthma Mother   . Diabetes Father        Insulin Dependent  . Heart disease Maternal Grandfather 60  . Diabetes Paternal Grandmother   . Heart disease Paternal Grandfather 52     Review of Systems  Constitutional: Negative for chills, fatigue and fever.  Respiratory: Negative for cough, chest tightness, shortness of breath and wheezing.   Cardiovascular: Negative for chest pain, palpitations and leg swelling.  Gastrointestinal: Negative for abdominal pain, constipation, diarrhea, nausea and vomiting.  Genitourinary: Positive for testicular pain. Negative for difficulty urinating, discharge, flank pain, frequency, hematuria, penile pain, penile swelling, scrotal swelling and urgency.    Objective:  There were no vitals taken for this visit.      BP Readings from Last 3 Encounters:  04/22/15 120/70  04/07/15 122/78  05/20/14 100/70   Wt Readings from Last 3 Encounters:  04/22/15 138 lb (62.6 kg)  04/07/15 135 lb (61.2 kg)  05/20/14 127 lb (57.6 kg) (10 %, Z= -1.28)*   * Growth percentiles are based on CDC (Boys, 2-20 Years) data.    EXAM:  GENERAL: alert, oriented, appears well and in no acute distress  HEENT: atraumatic, conjunctiva clear, no obvious abnormalities on inspection of external nose and ears  NECK: normal movements of the  head and neck  LUNGS: on inspection no signs of respiratory distress, breathing rate appears normal, no obvious gross SOB, gasping or wheezing  CV: no obvious cyanosis  MS: moves all visible extremities without noticeable abnormality  PSYCH/NEURO: pleasant and cooperative, no obvious depression or anxiety, speech and thought processing grossly intact  Assessment/Plan  1. Testicular pain, right Will start with [place]; further eval pending results. - US Scrotum; Future  discussed healthy lifestyle behaviors - limiting alcohol, smoking. Will review again at upcoming physical.   I discussed the assessment and treatment plan with the patient. The patient was provided an opportunity to ask questions and all were answered. The patient agreed with the plan and demonstrated an understanding of the instructions.   The patient was advised to call back or seek an in-person evaluation if the symptoms worsen or if the condition fails to improve as anticipated.  Return for physical exam- will schedule after [place] results return.  I provided 20 minutes of non-face-to-face time during this encounter.   Micheline Rough, MD

## 2019-07-17 ENCOUNTER — Ambulatory Visit
Admission: RE | Admit: 2019-07-17 | Discharge: 2019-07-17 | Disposition: A | Discharge status: Home / Self Care | Payer: Commercial Managed Care - PPO | Source: Ambulatory Visit | Attending: Family Medicine | Admitting: Family Medicine

## 2019-07-17 ENCOUNTER — Other Ambulatory Visit: Payer: Self-pay | Admitting: Family Medicine

## 2019-07-17 DIAGNOSIS — N5089 Other specified disorders of the male genital organs: Secondary | ICD-10-CM

## 2019-07-17 DIAGNOSIS — N50811 Right testicular pain: Secondary | ICD-10-CM

## 2019-07-19 ENCOUNTER — Telehealth: Payer: Self-pay | Admitting: *Deleted

## 2019-07-19 NOTE — Telephone Encounter (Signed)
Copied from [place] [id number]. Topic: General - Other >> Jul 18, 2019  4:59 PM Yvette Rack wrote:  Reason for CRM: Pt stated he was returning call to Wendie Simmer. Pt requests call back.

## 2019-07-19 NOTE — Telephone Encounter (Signed)
I called the pt and informed him I did not notice Dr Ethlyn Gallery had already spoke with him previously to provide results and discuss appt for the specialist (see results note).

## 2019-07-19 NOTE — Telephone Encounter (Signed)
See results note. 

## 2019-07-19 NOTE — Telephone Encounter (Signed)
Copied from [place] [id number]. Topic: General - Other >> Jul 18, 2019  9:21 AM Mathis Bud wrote: Reason for CRM: Patient called requesting for PCP to call back to go over ultrasound results. Call back [phone]

## 2019-07-23 ENCOUNTER — Other Ambulatory Visit: Payer: Self-pay | Admitting: Urology

## 2019-07-26 ENCOUNTER — Other Ambulatory Visit: Payer: Self-pay

## 2019-07-26 ENCOUNTER — Encounter (HOSPITAL_BASED_OUTPATIENT_CLINIC_OR_DEPARTMENT_OTHER): Payer: Self-pay | Admitting: *Deleted

## 2019-07-26 NOTE — Progress Notes (Signed)
Spoke with patient via telephone for pre op interview. NPO after MN. No medications AM of surgery. Arrival time 0500.

## 2019-07-27 ENCOUNTER — Other Ambulatory Visit (HOSPITAL_COMMUNITY)
Admission: RE | Admit: 2019-07-27 | Discharge: 2019-07-27 | Disposition: A | Discharge status: Home / Self Care | Payer: Commercial Managed Care - PPO | Source: Ambulatory Visit | Attending: Urology | Admitting: Urology

## 2019-07-27 DIAGNOSIS — Z20828 Contact with and (suspected) exposure to other viral communicable diseases: Secondary | ICD-10-CM | POA: Insufficient documentation

## 2019-07-27 DIAGNOSIS — Z01812 Encounter for preprocedural laboratory examination: Secondary | ICD-10-CM | POA: Diagnosis not present

## 2019-07-28 LAB — NOVEL CORONAVIRUS, NAA (HOSP ORDER, SEND-OUT TO REF LAB; TAT 18-24 HRS): SARS-CoV-2, NAA: NOT DETECTED

## 2019-07-30 NOTE — H&P (Signed)
CC: I have swelling in my scrotum.  HPI: Scott Zimmerman is a 23 year-old male patient who was referred by Dr. Judie Bonus, MD who is here for scrotal swelling.  The mass is on the right side.   Scott Zimmerman had the onset in the spring of a dull intermittent right testicular discomfort. It would come about once a month but he felt no mass. He had a scrotal US done that demonstrated a multifocal right testicular lesion consistent with a testicular neoplasm. He has a left epididymal cyst. He has had no voiding difficulty. He has had no weight loss recently with increased physical activity. He has had no night sweats. He has had prior testicular trauma.      ALLERGIES: None   MEDICATIONS: None   GU PSH: None     PSH Notes: oral surgery -wisdom teeth     NON-GU PSH: None   GU PMH: None   NON-GU PMH: Depression    FAMILY HISTORY: None   SOCIAL HISTORY: Marital Status: Single Preferred Language: English; Race: White Current Smoking Status: Patient does not smoke anymore.   Tobacco Use Assessment Completed: Used Tobacco in last 30 days? Drinks 3 drinks per day.  Patient uses recreational drugs. Uses marijuana. Drinks 2 caffeinated drinks per day. Patient's occupation Building services engineer..     Notes: Daily marijuana   REVIEW OF SYSTEMS:    GU Review Male:   Patient reports get up at night to urinate. Patient denies penile pain, trouble starting your stream, hard to postpone urination, have to strain to urinate , frequent urination, burning/ pain with urination, leakage of urine, stream starts and stops, and erection problems.  Gastrointestinal (Upper):   Patient denies nausea, vomiting, and indigestion/ heartburn.  Gastrointestinal (Lower):   Patient denies diarrhea and constipation.  Constitutional:   Patient denies fever, night sweats, weight loss, and fatigue.  Skin:   Patient denies skin rash/ lesion and itching.  Eyes:   Patient denies blurred vision and double vision.  Ears/  Nose/ Throat:   Patient denies sore throat and sinus problems.  Hematologic/Lymphatic:   Patient denies swollen glands and easy bruising.  Cardiovascular:   Patient denies leg swelling and chest pains.  Respiratory:   Patient denies cough and shortness of breath.  Endocrine:   Patient denies excessive thirst.  Musculoskeletal:   Patient denies back pain and joint pain.  Neurological:   Patient denies headaches and dizziness.  Psychologic:   Patient reports anxiety. Patient denies depression.   VITAL SIGNS:      07/23/2019 01:27 PM  Weight 130 lb / 58.97 kg  Height 63 in / 160.02 cm  BP 136/77 mmHg  Pulse 76 /min  Temperature 97.6 F / 36.4 C  BMI 23.0 kg/m   GU PHYSICAL EXAMINATION:    Scrotum: No lesions. No edema. No cysts. No warts.  Epididymides: Right: no spermatocele, no masses, no cysts, no tenderness, no induration, no enlargement. Left: no spermatocele, no masses, no cysts, no tenderness, no induration, no enlargement.  Testes: Atrophic right testis. Solid mass right testis about 1.5cm in the mid testes. . No tenderness, no swelling, no enlargement left testis. Normal location left testis. Normal location right testis. No mass, no cyst, no varicocele, no hydrocele left testis. No cyst, no varicocele, no hydrocele right testis.   Urethral Meatus: Normal size. No lesion, no wart, no discharge, no polyp. Normal location.  Penis: Circumcised, no warts, no cracks. No dorsal Peyronie's plaques, no left corporal Peyronie's plaques, no  right corporal Peyronie's plaques, no scarring, no warts. No balanitis, no meatal stenosis.   MULTI-SYSTEM PHYSICAL EXAMINATION:    Constitutional: Well-nourished. No physical deformities. Normally developed. Good grooming.  Neck: Neck symmetrical, not swollen. Normal tracheal position.  Respiratory: Normal breath sounds. No labored breathing, no use of accessory muscles.   Cardiovascular: Regular rate and rhythm. No murmur, no gallop.   Lymphatic: No  enlargement, no tenderness of supraclavicular, groin, neck lymph nodes.  Skin: No paleness, no jaundice, no cyanosis. No lesion, no ulcer, no rash.  Neurologic / Psychiatric: Oriented to time, oriented to place, oriented to person. No depression, no anxiety, no agitation.  Gastrointestinal: No mass, no tenderness, no rigidity, non obese abdomen.  Musculoskeletal: Normal gait and station of head and neck.     PAST DATA REVIEWED:  Source Of History:  Patient  Urine Test Review:   Urinalysis  X-Ray Review: Scrotal Ultrasound: Reviewed Films. Reviewed Report. Discussed With Patient. right testicular mass worrisome for neoplasm.    PROCEDURES:          Urinalysis Dipstick Dipstick Cont'd  Color: Yellow Bilirubin: Neg mg/dL  Appearance: Clear Ketones: Neg mg/dL  Specific Gravity: 1.015 Blood: Neg ery/uL  pH: 7.0 Protein: Neg mg/dL  Glucose: Neg mg/dL Urobilinogen: 0.2 mg/dL    Nitrites: Neg    Leukocyte Esterase: Neg leu/uL    ASSESSMENT:      ICD-10 Details  1 GU:   Testis, Right, Neoplasm of uncertain behavior - [id number] Right, He has a right testicular mass that is suspicious for a neoplasm. I am going to get markers and get him set up for right radical orchiectomy.   2   Testicular atrophy - N50.0 He does have some right testicular atrophy and left testicular atrophy with testicular length of 3.5cm on each side and since he uses marijuana daily I will add a testosterone to his labs. that order was sent to the lab directly.    PLAN:           Orders Labs CBC with Diff, CMP, Beta HCG, LDH, Alpha Fetoprotein (AFP)          Schedule Return Visit/Planned Activity: ASAP - Schedule Surgery          Document Letter(s):  Created for Patient: Clinical Summary         Notes:   CC: Dr. Judie Bonus.

## 2019-07-31 ENCOUNTER — Ambulatory Visit (HOSPITAL_BASED_OUTPATIENT_CLINIC_OR_DEPARTMENT_OTHER): Payer: Commercial Managed Care - PPO | Admitting: Anesthesiology

## 2019-07-31 ENCOUNTER — Encounter (HOSPITAL_BASED_OUTPATIENT_CLINIC_OR_DEPARTMENT_OTHER): Payer: Self-pay | Admitting: Anesthesiology

## 2019-07-31 ENCOUNTER — Other Ambulatory Visit: Payer: Self-pay

## 2019-07-31 ENCOUNTER — Ambulatory Visit (HOSPITAL_BASED_OUTPATIENT_CLINIC_OR_DEPARTMENT_OTHER)
Admission: RE | Admit: 2019-07-31 | Discharge: 2019-07-31 | Disposition: A | Discharge status: Home / Self Care | Payer: Commercial Managed Care - PPO | Attending: Urology | Admitting: Urology

## 2019-07-31 ENCOUNTER — Encounter (HOSPITAL_BASED_OUTPATIENT_CLINIC_OR_DEPARTMENT_OTHER)
Admission: RE | Disposition: A | Discharge status: Home / Self Care | Payer: Self-pay | Source: Home / Self Care | Attending: Urology

## 2019-07-31 DIAGNOSIS — N500 Atrophy of testis: Secondary | ICD-10-CM | POA: Insufficient documentation

## 2019-07-31 DIAGNOSIS — Z87891 Personal history of nicotine dependence: Secondary | ICD-10-CM | POA: Insufficient documentation

## 2019-07-31 DIAGNOSIS — N509 Disorder of male genital organs, unspecified: Secondary | ICD-10-CM | POA: Diagnosis present

## 2019-07-31 DIAGNOSIS — C6291 Malignant neoplasm of right testis, unspecified whether descended or undescended: Secondary | ICD-10-CM | POA: Insufficient documentation

## 2019-07-31 HISTORY — PX: ORCHIECTOMY: SHX2116

## 2019-07-31 SURGERY — ORCHIECTOMY
Anesthesia: General | Site: Scrotum | Laterality: Right

## 2019-07-31 MED ORDER — ACETAMINOPHEN 650 MG RE SUPP
650.0000 mg | RECTAL | Status: DC | PRN
  Filled 2019-07-31: qty 1

## 2019-07-31 MED ORDER — LIDOCAINE HCL (CARDIAC) PF 100 MG/5ML IV SOSY
PREFILLED_SYRINGE | INTRAVENOUS | Status: DC | PRN
  Administered 2019-07-31: 100 mg via INTRAVENOUS

## 2019-07-31 MED ORDER — OXYCODONE HCL 5 MG PO TABS
5.0000 mg | ORAL_TABLET | ORAL | Status: DC | PRN
  Filled 2019-07-31: qty 2

## 2019-07-31 MED ORDER — LIDOCAINE 2% (20 MG/ML) 5 ML SYRINGE
INTRAMUSCULAR | Status: AC
  Filled 2019-07-31: qty 5

## 2019-07-31 MED ORDER — SODIUM CHLORIDE 0.9% FLUSH
3.0000 mL | INTRAVENOUS | Status: DC | PRN
  Filled 2019-07-31: qty 3

## 2019-07-31 MED ORDER — BUPIVACAINE HCL (PF) 0.25 % IJ SOLN
INTRAMUSCULAR | Status: DC | PRN
  Administered 2019-07-31: 6 mL

## 2019-07-31 MED ORDER — HYDROMORPHONE HCL 1 MG/ML IJ SOLN
0.2500 mg | INTRAMUSCULAR | Status: DC | PRN
  Filled 2019-07-31: qty 0.5

## 2019-07-31 MED ORDER — DEXAMETHASONE SODIUM PHOSPHATE 10 MG/ML IJ SOLN
INTRAMUSCULAR | Status: AC
  Filled 2019-07-31: qty 1

## 2019-07-31 MED ORDER — ONDANSETRON HCL 4 MG/2ML IJ SOLN
INTRAMUSCULAR | Status: DC | PRN
  Administered 2019-07-31: 4 mg via INTRAVENOUS

## 2019-07-31 MED ORDER — MIDAZOLAM HCL 5 MG/5ML IJ SOLN
INTRAMUSCULAR | Status: DC | PRN
  Administered 2019-07-31: 2 mg via INTRAVENOUS

## 2019-07-31 MED ORDER — PROPOFOL 10 MG/ML IV BOLUS
INTRAVENOUS | Status: AC
  Filled 2019-07-31: qty 20

## 2019-07-31 MED ORDER — PROPOFOL 10 MG/ML IV BOLUS
INTRAVENOUS | Status: AC
  Filled 2019-07-31: qty 40

## 2019-07-31 MED ORDER — CEFAZOLIN SODIUM-DEXTROSE 2-4 GM/100ML-% IV SOLN
INTRAVENOUS | Status: AC
  Filled 2019-07-31: qty 100

## 2019-07-31 MED ORDER — OXYCODONE HCL 5 MG/5ML PO SOLN
5.0000 mg | Freq: Once | ORAL | Status: DC | PRN
  Filled 2019-07-31: qty 5

## 2019-07-31 MED ORDER — MORPHINE SULFATE (PF) 2 MG/ML IV SOLN
2.0000 mg | INTRAVENOUS | Status: DC | PRN
  Filled 2019-07-31: qty 1

## 2019-07-31 MED ORDER — DEXAMETHASONE SODIUM PHOSPHATE 4 MG/ML IJ SOLN
INTRAMUSCULAR | Status: DC | PRN
  Administered 2019-07-31: 10 mg via INTRAVENOUS

## 2019-07-31 MED ORDER — LACTATED RINGERS IV SOLN
INTRAVENOUS | Status: DC
  Administered 2019-07-31: 08:00:00 via INTRAVENOUS
  Filled 2019-07-31: qty 1000

## 2019-07-31 MED ORDER — PROPOFOL 10 MG/ML IV BOLUS
INTRAVENOUS | Status: DC | PRN
  Administered 2019-07-31: 200 mg via INTRAVENOUS
  Administered 2019-07-31: 100 mg via INTRAVENOUS
  Administered 2019-07-31 (×2): 50 mg via INTRAVENOUS

## 2019-07-31 MED ORDER — PROMETHAZINE HCL 25 MG/ML IJ SOLN
6.2500 mg | INTRAMUSCULAR | Status: DC | PRN
  Filled 2019-07-31: qty 1

## 2019-07-31 MED ORDER — SODIUM CHLORIDE (PF) 0.9 % IJ SOLN
INTRAMUSCULAR | Status: AC
  Filled 2019-07-31: qty 10

## 2019-07-31 MED ORDER — SODIUM CHLORIDE 0.9 % IV SOLN
INTRAVENOUS | Status: DC | PRN
  Administered 2019-07-31: 500 mL

## 2019-07-31 MED ORDER — SODIUM CHLORIDE (PF) 0.9 % IJ SOLN
INTRAMUSCULAR | Status: DC | PRN
  Administered 2019-07-31: 20 mL

## 2019-07-31 MED ORDER — ACETAMINOPHEN 500 MG PO TABS
1000.0000 mg | ORAL_TABLET | Freq: Once | ORAL | Status: AC
  Administered 2019-07-31: 1000 mg via ORAL
  Filled 2019-07-31: qty 2

## 2019-07-31 MED ORDER — KETOROLAC TROMETHAMINE 30 MG/ML IJ SOLN
INTRAMUSCULAR | Status: DC | PRN
  Administered 2019-07-31: 30 mg via INTRAVENOUS

## 2019-07-31 MED ORDER — FENTANYL CITRATE (PF) 100 MCG/2ML IJ SOLN
INTRAMUSCULAR | Status: DC | PRN
  Administered 2019-07-31 (×4): 50 ug via INTRAVENOUS

## 2019-07-31 MED ORDER — FENTANYL CITRATE (PF) 100 MCG/2ML IJ SOLN
INTRAMUSCULAR | Status: AC
  Filled 2019-07-31: qty 2

## 2019-07-31 MED ORDER — KETOROLAC TROMETHAMINE 30 MG/ML IJ SOLN
30.0000 mg | Freq: Once | INTRAMUSCULAR | Status: DC | PRN
  Filled 2019-07-31: qty 1

## 2019-07-31 MED ORDER — MIDAZOLAM HCL 2 MG/2ML IJ SOLN
INTRAMUSCULAR | Status: AC
  Filled 2019-07-31: qty 2

## 2019-07-31 MED ORDER — SODIUM CHLORIDE 0.9 % IV SOLN
250.0000 mL | INTRAVENOUS | Status: DC | PRN
  Filled 2019-07-31: qty 250

## 2019-07-31 MED ORDER — HYDROCODONE-ACETAMINOPHEN 5-325 MG PO TABS: 1.0000 | ORAL_TABLET | Freq: Four times a day (QID) | ORAL | 0 refills | Status: DC | PRN

## 2019-07-31 MED ORDER — 0.9 % SODIUM CHLORIDE (POUR BTL) OPTIME: TOPICAL | Status: DC | PRN

## 2019-07-31 MED ORDER — OXYCODONE HCL 5 MG PO TABS
5.0000 mg | ORAL_TABLET | Freq: Once | ORAL | Status: DC | PRN
  Filled 2019-07-31: qty 1

## 2019-07-31 MED ORDER — ACETAMINOPHEN 500 MG PO TABS
ORAL_TABLET | ORAL | Status: AC
  Filled 2019-07-31: qty 2

## 2019-07-31 MED ORDER — ONDANSETRON HCL 4 MG/2ML IJ SOLN
INTRAMUSCULAR | Status: AC
  Filled 2019-07-31: qty 2

## 2019-07-31 MED ORDER — KETOROLAC TROMETHAMINE 30 MG/ML IJ SOLN
INTRAMUSCULAR | Status: AC
  Filled 2019-07-31: qty 1

## 2019-07-31 MED ORDER — CEFAZOLIN SODIUM-DEXTROSE 2-4 GM/100ML-% IV SOLN
2.0000 g | INTRAVENOUS | Status: AC
  Administered 2019-07-31: 2 g via INTRAVENOUS
  Filled 2019-07-31: qty 100

## 2019-07-31 MED ORDER — SODIUM CHLORIDE 0.9% FLUSH
3.0000 mL | Freq: Two times a day (BID) | INTRAVENOUS | Status: DC
  Filled 2019-07-31: qty 3

## 2019-07-31 MED ORDER — ACETAMINOPHEN 325 MG PO TABS
650.0000 mg | ORAL_TABLET | ORAL | Status: DC | PRN
  Filled 2019-07-31: qty 2

## 2019-07-31 SURGICAL SUPPLY — 60 items
ADH SKN CLS APL DERMABOND .7 (GAUZE/BANDAGES/DRESSINGS) ×1
APPLICATOR COTTON TIP 6IN STRL (MISCELLANEOUS) IMPLANT
BLADE CLIPPER SENSICLIP SURGIC (BLADE) ×3 IMPLANT
BLADE SURG 15 STRL LF DISP TIS (BLADE) ×1 IMPLANT
BLADE SURG 15 STRL SS (BLADE) ×3
BNDG GAUZE ELAST 4 BULKY (GAUZE/BANDAGES/DRESSINGS) ×3 IMPLANT
CANISTER SUCTION 1200CC (MISCELLANEOUS) ×3 IMPLANT
CLEANER CAUTERY TIP 5X5 PAD (MISCELLANEOUS) ×1 IMPLANT
COVER BACK TABLE 60X90IN (DRAPES) ×3 IMPLANT
COVER MAYO STAND STRL (DRAPES) ×3 IMPLANT
COVER WAND RF STERILE (DRAPES) ×6 IMPLANT
DERMABOND ADVANCED (GAUZE/BANDAGES/DRESSINGS) ×2
DERMABOND ADVANCED .7 DNX12 (GAUZE/BANDAGES/DRESSINGS) ×1 IMPLANT
DISSECTOR ROUND CHERRY 3/8 STR (MISCELLANEOUS) IMPLANT
DRAIN PENROSE 18X1/4 LTX STRL (WOUND CARE) ×2 IMPLANT
DRAPE LAPAROTOMY TRNSV 102X78 (DRAPES) ×3 IMPLANT
DRSG TEGADERM 4X4.75 (GAUZE/BANDAGES/DRESSINGS) IMPLANT
ELECT NDL TIP 2.8 STRL (NEEDLE) IMPLANT
ELECT NEEDLE TIP 2.8 STRL (NEEDLE) IMPLANT
ELECT REM PT RETURN 9FT ADLT (ELECTROSURGICAL) ×3
ELECTRODE REM PT RTRN 9FT ADLT (ELECTROSURGICAL) ×1 IMPLANT
GAUZE SPONGE 4X4 12PLY STRL (GAUZE/BANDAGES/DRESSINGS) IMPLANT
GLOVE BIO SURGEON STRL SZ7 (GLOVE) ×4 IMPLANT
GLOVE BIOGEL PI IND STRL 7.5 (GLOVE) IMPLANT
GLOVE BIOGEL PI INDICATOR 7.5 (GLOVE) ×6
GLOVE SURG SS PI 8.0 STRL IVOR (GLOVE) ×3 IMPLANT
GOWN STRL REUS W/TWL LRG LVL3 (GOWN DISPOSABLE) ×4 IMPLANT
GOWN STRL REUS W/TWL XL LVL3 (GOWN DISPOSABLE) ×3 IMPLANT
KIT TURNOVER CYSTO (KITS) ×3 IMPLANT
NEEDLE HYPO 22GX1.5 SAFETY (NEEDLE) ×3 IMPLANT
NS IRRIG 500ML POUR BTL (IV SOLUTION) IMPLANT
PACK BASIN DAY SURGERY FS (CUSTOM PROCEDURE TRAY) ×3 IMPLANT
PAD CLEANER CAUTERY TIP 5X5 (MISCELLANEOUS) ×2
PENCIL BUTTON HOLSTER BLD 10FT (ELECTRODE) ×3 IMPLANT
PROSTHESIS TESTICULAR NAC LRG (Urological Implant) IMPLANT
SET COLLECT BLD 21X.75 12 PB G (NEEDLE) ×2 IMPLANT
SET COLLECT BLD 21X3/4 12 (NEEDLE) ×2 IMPLANT
SUPPORT SCROTAL LG STRP (MISCELLANEOUS) IMPLANT
SUPPORTER ATHLETIC LG (MISCELLANEOUS)
SUT CHROMIC 3 0 SH 27 (SUTURE) ×3 IMPLANT
SUT CHROMIC GUT AB #0 18 (SUTURE) IMPLANT
SUT MNCRL AB 4-0 PS2 18 (SUTURE) ×2 IMPLANT
SUT PROLENE 2 0 CT2 30 (SUTURE) ×2 IMPLANT
SUT SILK 3 0 TIES 17X18 (SUTURE)
SUT SILK 3-0 18XBRD TIE BLK (SUTURE) IMPLANT
SUT VIC AB 3-0 SH 27 (SUTURE) ×3
SUT VIC AB 3-0 SH 27X BRD (SUTURE) IMPLANT
SUT VICRYL 0 TIES 12 18 (SUTURE) ×2 IMPLANT
SUT VICRYL 2 0 18  UND BR (SUTURE) ×2
SUT VICRYL 2 0 18 UND BR (SUTURE) IMPLANT
SYR 20CC LL (SYRINGE) ×3 IMPLANT
SYR BULB IRRIGATION 50ML (SYRINGE) ×3 IMPLANT
SYR CONTROL 10ML LL (SYRINGE) ×3 IMPLANT
TESTICULAR PROSTHESIS NAC LRG (Urological Implant) ×3 IMPLANT
TOWEL OR 17X26 10 PK STRL BLUE (TOWEL DISPOSABLE) ×6 IMPLANT
TRAY DSU PREP LF (CUSTOM PROCEDURE TRAY) ×3 IMPLANT
TUBE CONNECTING 12'X1/4 (SUCTIONS) ×1
TUBE CONNECTING 12X1/4 (SUCTIONS) ×2 IMPLANT
WATER STERILE IRR 500ML POUR (IV SOLUTION) IMPLANT
YANKAUER SUCT BULB TIP NO VENT (SUCTIONS) ×3 IMPLANT

## 2019-07-31 NOTE — Anesthesia Preprocedure Evaluation (Signed)
Anesthesia Evaluation  Patient identified by MRN, date of birth, ID band Patient awake    Reviewed: Allergy & Precautions, NPO status , Patient's Chart, lab work & pertinent test results  Airway Mallampati: I  TM Distance: >3 FB Neck ROM: Full    Dental no notable dental hx.    Pulmonary Patient abstained from smoking., former smoker,    Pulmonary exam normal breath sounds clear to auscultation       Cardiovascular negative cardio ROS   Rhythm:Regular Rate:Tachycardia     Neuro/Psych PSYCHIATRIC DISORDERS Depression negative neurological ROS     GI/Hepatic negative GI ROS, (+)     substance abuse  marijuana use,   Endo/Other  negative endocrine ROS  Renal/GU negative Renal ROS     Musculoskeletal negative musculoskeletal ROS (+)   Abdominal   Peds  Hematology negative hematology ROS (+)   Anesthesia Other Findings RIGHT TESTICULAR MASS  Reproductive/Obstetrics                             Anesthesia Physical Anesthesia Plan  ASA: II  Anesthesia Plan: General   Post-op Pain Management:    Induction: Intravenous  PONV Risk Score and Plan: 2 and Ondansetron, Dexamethasone, Midazolam and Treatment may vary due to age or medical condition  Airway Management Planned: LMA  Additional Equipment:   Intra-op Plan:   Post-operative Plan: Extubation in OR  Informed Consent: I have reviewed the patients History and Physical, chart, labs and discussed the procedure including the risks, benefits and alternatives for the proposed anesthesia with the patient or authorized representative who has indicated his/her understanding and acceptance.     Dental advisory given  Plan Discussed with: CRNA  Anesthesia Plan Comments:         Anesthesia Quick Evaluation

## 2019-07-31 NOTE — Discharge Instructions (Addendum)
Orchiectomy, Care After This sheet gives you information about how to care for yourself after your procedure. Your health care provider may also give you more specific instructions. If you have problems or questions, contact your health care provider. What can I expect after the procedure? After the procedure, it is common to have:  Pain.  Bruising.  Blood pooling (hematoma) in the area where your testicles were removed.  Depression or mood changes.  Fatigue.  Hot flashes. Follow these instructions at home: Managing pain and swelling  If directed, put ice on the affected area: ? Put ice in a plastic bag. ? Place a towel between your skin and the bag. ? Leave the ice on for 20 minutes, 2-3 times a day.  Wear scrotal support as told by your health care provider.  To relieve pressure and pain when sitting, you may use a donut cushion if directed by your health care provider. Incision care   Follow instructions from your health care provider about how to take care of your incision. Make sure you: ? Wash your hands with soap and water before you change your bandage (dressing). If soap and water are not available, use hand sanitizer. ? Change your dressing once a day, or as often as told by your health care provider. If the dressing sticks to your incision area: ? Use warm, soapy water or hydrogen peroxide to dampen the dressing. ? When the dressing becomes loose, lift it from the incision area. Make sure that the incision stays closed. ? Leave stitches (sutures), skin glue, or adhesive strips in place. These skin closures may need to stay in place for 2 weeks or longer. If adhesive strip edges start to loosen and curl up, you may trim the loose edges. Do not remove adhesive strips completely unless your health care provider tells you to do that.  Keep your dressing dry until it has been removed.  Check your incision area every day for signs of infection. Check for: ? More redness,  swelling, or pain. ? More fluid or blood. ? Warmth. ? Pus or a bad smell. Bathing  Do not take baths, swim, or use a hot tub until your health care provider approves. You may start taking showers two days after your procedure.  Do not rub your incision to dry it. Pat the area gently with a clean cloth or let it air-dry. Medicines  Take over-the-counter and prescription medicines only as told by your health care provider.  If you were prescribed an antibiotic medicine, use it as told by your health care provider. Do not stop using the antibiotic even if you start to feel better.  If you had both testicles removed, talk with your health care provider about medicine supplements to replace one of the male hormones (testosterone) that your body will no longer make. Driving  Do not drive for 24 hours if you were given a medicine to help you relax (sedative).  Do not drive or use heavy machinery while taking prescription pain medicines. Activity  Avoid activities that may cause your incision to open, such as jogging, playing sports, and straining with bowel movements. Ask your health care provider what activities are safe for you.  Do not lift anything that is heavier than 10 lb (4.5 kg), or the limit that your health care provider tells you, until he or she says that it is safe.  Do not engage in sexual activity until the area is healed and your health care provider approves.  This could take up to 4 weeks. General instructions  To prevent or treat constipation while you are taking prescription pain medicine, your health care provider may recommend that you: ? Drink enough fluid to keep your urine clear or pale yellow. ? Take over-the-counter or prescription medicines. ? Eat foods that are high in fiber, such as fresh fruits and vegetables, whole grains, and beans. ? Limit foods that are high in fat and processed sugars, such as fried and sweet foods.  Do not use any products that  contain nicotine or tobacco, such as cigarettes and e-cigarettes. If you need help quitting, ask your health care provider.  Keep all follow-up visits as told by your health care provider. This is important. Contact a health care provider if:  You have more pain, swelling, or redness in your genital or groin area.  You have more fluid or blood coming from your incision.  Your incision feels warm to the touch.  You have pus or a bad smell coming from your incision.  You have constipation that is not helped by changing your diet or drinking more fluid.  You develop nausea or vomiting.  You cannot eat or drink without vomiting. Get help right away if:  You have dizziness or nausea that does not go away.  You have trouble breathing.  You have a wet (congested) cough.  You have a fever or shaking chills.  Your incision breaks open after the skin closures have been removed.  You are not able to urinate. Summary  After this procedure, it is most common to have bruising or blood pooling in the area where the testicles were removed.  You should check your incision area every day for signs of infection, such as redness, swelling, pain, fluid, blood, warmth, pus, or a bad smell.  Avoid activities that may cause your incision to open, such as jogging, playing sports, and straining with bowel movements. Ask your health care provider what activities are safe for you.  You should not engage in sexual activity until the area is healed and your health care provider approves. This could take up to 4 weeks.  Men who have both testicles removed may have emotional and physical side effects. Your health care provider can help you with ways to manage those side effects. This information is not intended to replace advice given to you by your health care provider. Make sure you discuss any questions you have with your health care provider. Document Released: 06/20/2013 Document Revised: 09/30/2017  Document Reviewed: 09/09/2016 Elsevier Patient Education  [place] Instructions  Activity: Get plenty of rest for the remainder of the day. A responsible individual must stay with you for 24 hours following the procedure.  For the next 24 hours, DO NOT: -Drive a car -Paediatric nurse -Drink alcoholic beverages -Take any medication unless instructed by your physician -Make any legal decisions or sign important papers.  Meals: Start with liquid foods such as gelatin or soup. Progress to regular foods as tolerated. Avoid greasy, spicy, heavy foods. If nausea and/or vomiting occur, drink only clear liquids until the nausea and/or vomiting subsides. Call your physician if vomiting continues.  Special Instructions/Symptoms: Your throat may feel dry or sore from the anesthesia or the breathing tube placed in your throat during surgery. If this causes discomfort, gargle with warm salt water. The discomfort should disappear within 24 hours.      Ibuprofen may be taken after 5:30 pm  today.

## 2019-07-31 NOTE — Op Note (Signed)
Procedure: 1.  Right radical orchiectomy. 2.  Insertion of right testicular prosthesis.  Preop diagnosis: Right testicular mass.  Postop diagnosis: Same.  Surgeon: Dr. Irine Seal.  Anesthesia: General with local.  Specimen: Right testicle and cord.  Drains: None.  Prosthesis: Large right [facility] testicular prosthesis, batch [id number], catalog [phone].  EBL: 2 mL.  Complications: None.  Indications: Tyler is a 23 year old male who was seen for right testicular mass which was suspicious for neoplasm on ultrasound.  He is to undergo right radical orchiectomy with insertion of a right testicular prosthesis.  His preoperative tumor markers were negative.  Procedure: He was given 2 g of Ancef.  A general anesthetic was induced and he was left in the supine position.  He was fitted with PAS hose.  There is lower abdomen and genitalia were clipped.  He was prepped with Betadine solution and draped in usual sterile fashion.  A 4 cm incision was made with a knife along the skin lines in the low right inguinal location.  After the initial incision the wound edges were infiltrated with 6 mL of quarter percent Marcaine.  The subcutaneous tissue was incised with the Bovie and 2 small veins were cauterized.  The external oblique fascia was exposed and incised along the fibers over the cord.  The ilioinguinal nerve was identified and protected.  The cord was dissected out and 1/4 inch Penrose drain was doubly wrapped around the cord to occlude the vascular supply to the testicle.  The testicle was then delivered into the wound and the gubernacular attachments were taken down.  Some additional cremasteric attachments were divided and a vein within these attachments was ligated and divided between 2-0 Vicryl ties.  The cord was then separated with the 2 packets at the level of the internal ring clamp and then divided.  The packets were then doubly ligated with 0 Vicryl ties which were cut long to aid  identification in the future if needed.  A large Coloplast Roso testicular prosthesis was then prepared with an antibiotic soak and filling with approximately 18 mL of sterile saline.  A 2-0 Prolene stitch was placed into the scrotal wall identified by finger pressure from the most dependent portion of the scrotum with great care being taken to avoid full-thickness penetration of the scrotal wall.  The testicular prosthesis was then secured dependently in the scrotum with a 2-0 Prolene.  Wound was irrigated with antibiotic solution.  The external oblique fascia was then closed using a running 3-0 Vicryl once again with care taken to avoid injury to the nerve.  The wound was irrigated once again and then the skin was closed using a running 4-0 Monocryl subcuticular stitch.  The wound was cleansed and dried and reinforced with Dermabond.  After the Dermabond had dried, a dressing was applied to the wound and a fluffed Curlex was placed.  A scrotal support was used to hold the Kerlix in place.  The anesthetic was reversed and he was moved recovery in stable condition.  There were no complications.

## 2019-07-31 NOTE — Anesthesia Postprocedure Evaluation (Signed)
Anesthesia Post Note  Patient: Scott Zimmerman  Procedure(s) Performed: ORCHIECTOMY, testicular implant (Right Scrotum)     Patient location during evaluation: PACU Anesthesia Type: General Level of consciousness: awake and alert Pain management: pain level controlled Vital Signs Assessment: post-procedure vital signs reviewed and stable Respiratory status: spontaneous breathing, nonlabored ventilation, respiratory function stable and patient connected to nasal cannula oxygen Cardiovascular status: blood pressure returned to baseline and stable Postop Assessment: no apparent nausea or vomiting Anesthetic complications: no    Last Vitals:  Vitals:   07/31/19 1215 07/31/19 1258  BP: (!) 109/58 109/72  Pulse: 82 64  Resp: 15 16  Temp:  36.6 C  SpO2: 100% 100%    Last Pain:  Vitals:   07/31/19 1258  TempSrc:   PainSc: 2                  Ryan P Ellender

## 2019-07-31 NOTE — Anesthesia Procedure Notes (Signed)
Procedure Name: LMA Insertion Date/Time: 07/31/2019 10:37 AM Performed by: Justice Rocher, CRNA Pre-anesthesia Checklist: Patient identified, Emergency Drugs available, Suction available and Patient being monitored Patient Re-evaluated:Patient Re-evaluated prior to induction Oxygen Delivery Method: Circle system utilized Preoxygenation: Pre-oxygenation with 100% oxygen Induction Type: IV induction Ventilation: Mask ventilation without difficulty LMA: LMA inserted LMA Size: 4.0 Number of attempts: 1 Airway Equipment and Method: Bite block Placement Confirmation: positive ETCO2 and breath sounds checked- equal and bilateral Tube secured with: Tape Dental Injury: Teeth and Oropharynx as per pre-operative assessment

## 2019-07-31 NOTE — Interval H&P Note (Signed)
History and Physical Interval Note:  His tumor markers are all negative.  His testosterone was borderline low.   I discussed the impact of marijuana on testosterone production and testicular function.   07/31/2019 9:57 AM  Scott Zimmerman  has presented today for surgery, with the diagnosis of RIGHT TESTICULAR MASS.  The various methods of treatment have been discussed with the patient and family. After consideration of risks, benefits and other options for treatment, the patient has consented to  Procedure(s) with comments: ORCHIECTOMY (Right) - 1 HR as a surgical intervention.  The patient's history has been reviewed, patient examined, no change in status, stable for surgery.  I have reviewed the patient's chart and labs.  Questions were answered to the patient's satisfaction.     Irine Seal

## 2019-07-31 NOTE — Transfer of Care (Signed)
Immediate Anesthesia Transfer of Care Note  Patient: Scott Zimmerman  Procedure(s) Performed: Procedure(s) (LRB): ORCHIECTOMY, testicular implant (Right)  Patient Location: PACU  Anesthesia Type: General  Level of Consciousness: awake, sedated, patient cooperative and responds to stimulation  Airway & Oxygen Therapy: Patient Spontanous Breathing and Patient connected to [place] O2 and soft face mask   Post-op Assessment: Report given to PACU RN, Post -op Vital signs reviewed and stable and Patient moving all extremities  Post vital signs: Reviewed and stable  Complications: No apparent anesthesia complications

## 2019-08-01 ENCOUNTER — Encounter (HOSPITAL_BASED_OUTPATIENT_CLINIC_OR_DEPARTMENT_OTHER): Payer: Self-pay | Admitting: Urology

## 2019-08-06 LAB — SURGICAL PATHOLOGY

## 2019-08-07 ENCOUNTER — Telehealth: Payer: Self-pay | Admitting: Oncology

## 2019-08-07 NOTE — Telephone Encounter (Signed)
Received a new patient referral from Dr. Jeffie Pollock at [facility] for dx of testis cancer. Scott Zimmerman has been cld and scheduled to see Scott Zimmerman on 10/7 at 1pm. He's been made aware to arrive 15 minutes early.

## 2019-08-08 ENCOUNTER — Other Ambulatory Visit: Payer: Self-pay

## 2019-08-08 ENCOUNTER — Inpatient Hospital Stay: Payer: Commercial Managed Care - PPO | Attending: Oncology | Admitting: Oncology

## 2019-08-08 VITALS — BP 118/76 | HR 67 | Temp 98.5°F | Resp 17 | Ht 63.0 in | Wt 129.3 lb

## 2019-08-08 DIAGNOSIS — C6291 Malignant neoplasm of right testis, unspecified whether descended or undescended: Secondary | ICD-10-CM | POA: Diagnosis not present

## 2019-08-08 DIAGNOSIS — Z87891 Personal history of nicotine dependence: Secondary | ICD-10-CM | POA: Diagnosis not present

## 2019-08-08 NOTE — Progress Notes (Signed)
Reason for the request:    Testis cancer  HPI: I was asked by Dr. Jeffie Pollock to evaluate Mr. Scott Zimmerman for new diagnosis of testicular cancer.  He is a 23 year old man without any significant past medical history started developing left testicular discomfort since the summer.  He subsequently started noticing a mass as well.  He subsequently was referred to Dr. Jeffie Pollock and ultrasound obtained on 07/17/2019 showed a 2.4 x 1.5 x 2.1 cm mass of the right testicle it is suspicious for malignancy.  He underwent subsequently underwent right radical orchiectomy and insertion of a right testicular prosthesis completed on 07/31/2019 under the care of Dr. Jeffie Pollock.  A final pathology showed a mixed germ tumor measuring 2.0 cm with embryonal component at 60%, seminoma 35% and yolk sac at 5%.  Lymphovascular invasion was present at the time.  The final pathological staging was T2NX.  CT scan chest, abdomen and pelvis obtained on August 06, 2019 showed no evidence of lymphadenopathy or metastatic disease.  A small hypodensity of the liver that is nonspecific measuring 0.9 x 1.0 x 0.7 cm that is likely benign.  Based on these findings he was referred to me for evaluation.  Clinically, he reports no major complaints at this time.  He is fully recovered from his surgery and resumed most activities of daily living.  He does not report any headaches, blurry vision, syncope or seizures. Does not report any fevers, chills or sweats.  Does not report any cough, wheezing or hemoptysis.  Does not report any chest pain, palpitation, orthopnea or leg edema.  Does not report any nausea, vomiting or abdominal pain.  Does not report any constipation or diarrhea.  Does not report any skeletal complaints.    Does not report frequency, urgency or hematuria.  Does not report any skin rashes or lesions. Does not report any heat or cold intolerance.  Does not report any lymphadenopathy or petechiae.  Does not report any anxiety or depression.  Remaining review  of systems is negative.    Past Medical History:  Diagnosis Date  . Depression   . INGROWN TOENAIL, INFECTED 09/02/2010   Qualifier: Diagnosis of  By: Assunta Found MD, Annie Main    :  Past Surgical History:  Procedure Laterality Date  . NO PAST SURGERIES    . ORCHIECTOMY Right 07/31/2019   Procedure: ORCHIECTOMY, testicular implant;  Surgeon: Irine Seal, MD;  Location: [facility];  Service: Urology;  Laterality: Right;  1 HR  . WISDOM TOOTH EXTRACTION  2014  :   Current Outpatient Medications:  .  HYDROcodone-acetaminophen (NORCO/VICODIN) 5-325 MG tablet, Take 1 tablet by mouth every 6 (six) hours as needed for moderate pain., Disp: 8 tablet, Rfl: 0 .  triamcinolone cream (KENALOG) 0.5 %, Apply 1 application topically as needed., Disp: , Rfl: :  No Known Allergies:  Family History  Problem Relation Age of Onset  . Asthma Mother   . Diabetes Father        Insulin Dependent  . Heart disease Maternal Grandfather 60  . Diabetes Paternal Grandmother   . Heart disease Paternal Grandfather 33  :  Social History   Socioeconomic History  . Marital status: Single    Spouse name: Not on file  . Number of children: Not on file  . Years of education: Not on file  . Highest education level: Not on file  Occupational History  . Not on file  Social Needs  . Financial resource strain: Not on file  .  Food insecurity    Worry: Not on file    Inability: Not on file  . Transportation needs    Medical: Not on file    Non-medical: Not on file  Tobacco Use  . Smoking status: Former Smoker    Types: Cigarettes    Quit date: 11/01/2016    Years since quitting: 2.7  . Smokeless tobacco: Never Used  Substance and Sexual Activity  . Alcohol use: Yes    Alcohol/week: 18.0 standard drinks    Types: 18 Cans of beer per week    Comment: sometimes more  . Drug use: Yes    Frequency: 2.0 times per week    Types: Marijuana  . Sexual activity: Yes    Partners: Female    Birth  control/protection: Condom  Lifestyle  . Physical activity    Days per week: Not on file    Minutes per session: Not on file  . Stress: Not on file  Relationships  . Social Herbalist on phone: Not on file    Gets together: Not on file    Attends religious service: Not on file    Active member of club or organization: Not on file    Attends meetings of clubs or organizations: Not on file    Relationship status: Not on file  . Intimate partner violence    Fear of current or ex partner: Not on file    Emotionally abused: Not on file    Physically abused: Not on file    Forced sexual activity: Not on file  Other Topics Concern  . Not on file  Social History Narrative  . Not on file  :  Pertinent items are noted in HPI.  Exam:  Blood pressure 118/76, pulse 67, temperature 98.5 F (36.9 C), temperature source Oral, resp. rate 17, height 5\' 3"  (1.6 m), weight 129 lb 4.8 oz (58.7 kg), SpO2 100 %.   ECOG 0 General appearance: alert and cooperative appeared without distress. Head: atraumatic without any abnormalities. Eyes: conjunctivae/corneas clear. PERRL.  Sclera anicteric. Throat: lips, mucosa, and tongue normal; without oral thrush or ulcers. Resp: clear to auscultation bilaterally without rhonchi, wheezes or dullness to percussion. Cardio: regular rate and rhythm, S1, S2 normal, no murmur, click, rub or gallop GI: soft, non-tender; bowel sounds normal; no masses,  no organomegaly Skin: Skin color, texture, turgor normal. No rashes or lesions Lymph nodes: Cervical, supraclavicular, and axillary nodes normal. Neurologic: Grossly normal without any motor, sensory or deep tendon reflexes. Musculoskeletal: No joint deformity or effusion.  No results for input(s): WBC, HGB, HCT, PLT in the last 72 hours. No results for input(s): NA, K, CL, CO2, GLUCOSE, BUN, CREATININE, CALCIUM in the last 72 hours.    US Scrotum W/doppler  Result Date: 07/17/2019 CLINICAL DATA:   Pain right scrotum EXAM: SCROTAL ULTRASOUND DOPPLER ULTRASOUND OF THE TESTICLES TECHNIQUE: Complete ultrasound examination of the testicles, epididymis, and other scrotal structures was performed. Color and spectral Doppler ultrasound were also utilized to evaluate blood flow to the testicles. COMPARISON:  None. FINDINGS: Right testicle Measurements: 3.5 x 1.9 x 2.5 cm. There is a hypoechoic mass in the right testis which is hypervascular, measuring 2.4 x 1.5 x 2.1 cm. Several smaller hypoechoic areas are noted throughout the right testis which also appear hypervascular. There are multiple calcifications within the right testis. Left testicle Measurements: 3.6 x 1.5 x 2.5 cm. No mass evident. There is microlithiasis in the left testis. Right epididymis:  Normal in size and appearance. Left epididymis: There is a cyst at the junction of the body and tail of the left epididymis measuring 4 x 4 x 3 mm. No evidence of epididymitis. Hydrocele:  There is a rather minimal hydrocele on each side. Varicocele:  None visualized. Pulsed Doppler interrogation of both testes demonstrates normal low resistance arterial and venous waveforms bilaterally. IMPRESSION: 1. Hypervascular, hypoechoic masses within the right testis, largest measuring 2.4 x 1.5 x 2.1 cm. Suspect multifocal neoplastic lesions within the right testis given this appearance. Appropriate urologic consultation advised in this regard. 2. Small calcifications noted in each testis. Current literature suggests that testicular microlithiasis is not a significant independent risk factor for development of testicular carcinoma, and that follow up imaging is not warranted in the absence of other risk factors. Monthly testicular self-examination and annual physical exams are considered appropriate surveillance. If patient has other risk factors for testicular carcinoma, then referral to Urology should be considered. (Reference: DeCastro, et al.: A 5-Year Follow up Study  of Asymptomatic Men with Testicular Microlithiasis. J Urol 2008; [id number].) 3. Small left epididymal cyst. No evidence of epididymitis on either side. 4.  Rather minimal hydrocele on each side. These results will be called to the ordering clinician or representative by the Radiologist Assistant, and communication documented in the PACS or zVision Dashboard. Electronically Signed   By: Lowella Grip III M.D.   On: 07/17/2019 11:52    Assessment and Plan:   23 year old with:  1.  Right testicular cancer diagnosed in September 2019.  He presented with right testicular mass that has grew over 30.  Of months.  He subsequently underwent orchiectomy on 07/31/2019 with the final pathological staging showed a 2.0 cm tumor with 60% embryonal as well as component of seminoma and yolk sac tumor.  Staging CT scan did not show any evidence of metastatic disease.  The natural course of this disease was reviewed today and appears to have stage Ib disease pending tumor markers that are unavailable to me at this moment.  Under this assumption, treatment options were reviewed today with the patient in detail.  Active surveillance is the standard of care for patients with out adverse features.  Given the fact that he has lymphovascular invasion and a high percentage of embryonal component, the likelihood of disease recurrence could reach up to 40% which exceeds the threshold of tolerance for a lot of patients.  Despite that, this remains a an option if he refused to proceed with any additional treatments.  That will include surveillance imaging studies including CT scan of the abdomen and pelvis, chest x-ray and periodic CT scan of the chest if needed.  Adjuvant chemotherapy in the form of bleomycin, etoposide and cisplatin (BEP) for 1-2 cycle remains a reasonable option as well.  The rationale and logistics of administration of chemotherapy was reviewed.  Potential complications with nausea, vomiting,  myelosuppression, neutropenia, neutropenic sepsis as well as infusion related complications.  Kidney dysfunction, electrolyte imbalance as well as pulmonary complications were reviewed.  Specifically bleomycin pulmonary complications were reemphasized including the importance of obtaining pulmonary function test prior to start. He understands with chemotherapy the risk of relapse is reduced although not eliminated.  The third and last option would include retroperitoneal lymph node dissection.  This is a more attractive option because it offers both staging and treatment options at this time.  He understands though that this requires surgical intervention and possible referral to tertiary care center with high-volume expertise.  Lymphedema and retrograde ejaculation remains a concern at this particular setting.  Risks and benefits and the rationale for all these options were discussed today in detail.  All his questions were answered.  He would like to discuss this further with his family before making his final decision.  2.  Fertility issues: We have discussed the role of sperm banking specially if he is considering chemotherapy or surgery options.  He is well aware of the risk of infertility associated with these treatments and as all be resources and information to pursue that option if he desires.  3.  Follow-up: Will be determined pending the decision of which treatment he is desiring.   60  minutes was spent with the patient face-to-face today.  More than 50% of time was spent on reviewing his disease status, reviewing imaging studies, treatment options and answering questions regarding complications and future plan of care.    Thank you for the referral. A copy of this consult has been forwarded to the requesting physician.

## 2019-08-13 ENCOUNTER — Other Ambulatory Visit: Payer: Self-pay | Admitting: Oncology

## 2019-08-13 ENCOUNTER — Telehealth: Payer: Self-pay | Admitting: Oncology

## 2019-08-13 ENCOUNTER — Telehealth: Payer: Self-pay

## 2019-08-13 DIAGNOSIS — C6291 Malignant neoplasm of right testis, unspecified whether descended or undescended: Secondary | ICD-10-CM | POA: Insufficient documentation

## 2019-08-13 MED ORDER — PROCHLORPERAZINE MALEATE 10 MG PO TABS: 10.0000 mg | ORAL_TABLET | Freq: Four times a day (QID) | ORAL | 0 refills | Status: DC | PRN

## 2019-08-13 NOTE — Telephone Encounter (Signed)
Patient made aware to expect a call from scheduling. Verbalized understanding.

## 2019-08-13 NOTE — Telephone Encounter (Signed)
-----   Message from Wyatt Portela, MD sent at 08/13/2019 10:24 AM EDT ----- Message sent to scheduling. Thanks.  ----- Message ----- From: Tami Lin, RN Sent: 08/13/2019   8:43 AM EDT To: Wyatt Portela, MD  Patient called and wants to let you know that after his visit last week he has decided that he wants to go ahead with preventive chemo. Lanelle Bal

## 2019-08-13 NOTE — Telephone Encounter (Signed)
Scheduled appt per 10/12 sch message - pt is aware of appt date and time - my chart active .   Also called PFT - [facility] - no answer - left message for them to contact patient to set up pulmonary function test.

## 2019-08-13 NOTE — Progress Notes (Signed)
START ON PATHWAY REGIMEN - Testicular     A cycle is every 21 days:     Bleomycin      Etoposide      Cisplatin   **Always confirm dose/schedule in your pharmacy ordering system**  Patient Characteristics: Nonseminoma, Newly Diagnosed, Stage I, Not a Candidate for Surveillance AJCC T Category: TX AJCC N Category: NX AJCC M Category: M0 AJCC S Category Post-Orchiectomy: SX AJCC 8 Stage Grouping: IB Histology: Nonseminoma  Intent of Therapy: Curative Intent, Discussed with Patient

## 2019-08-20 ENCOUNTER — Telehealth: Payer: Self-pay

## 2019-08-20 NOTE — Telephone Encounter (Signed)
Please see message below. Patient to pick letter up at front desk later today.

## 2019-08-20 NOTE — Telephone Encounter (Signed)
-----   Message from Wyatt Portela, MD sent at 08/20/2019 10:43 AM EDT ----- Regarding: RE: Request for medical excuse from jury duty [employer] to give. He will be undergoing chemotherapy and should be excused. Thanks ----- Message ----- From: Teodoro Spray, RN Sent: 08/20/2019  10:32 AM EDT To: Wyatt Portela, MD Subject: Request for medical excuse from jury duty      Patient is requesting a medical excuse letter for jury duty (09/05/19).  Please advise.

## 2019-08-20 NOTE — Telephone Encounter (Signed)
Patient called office stating he had recently tested positive for Covid. Patient stated he was tested on 08/17/19 and received the results on 08/19/19. Patient has CT scan scheduled for 08/22/19 and MD follow-up on 08/24/19. Patient advised to call central scheduling to reschedule CT scan and to also state the reason for rescheduling the scan was due to a positive covid test. Patient verbalized understanding.  Patient called office back to state the rescheduled date and time of the CT scan. Scheduling message sent to reschedule patient's lab and MD follow-up appointment. Patient informed that scheduling department will call with updated date and time for labs and follow-up.  Patient verbalized understanding.

## 2019-08-21 ENCOUNTER — Telehealth: Payer: Self-pay

## 2019-08-21 NOTE — Telephone Encounter (Signed)
Central scheduling called. Covid testing appointment scheduled for 08/24/19 at 8:20am at the [facility]. Patient made aware of Covid testing date and time. Patient verbalized understanding.

## 2019-08-21 NOTE — Telephone Encounter (Signed)
Pharmacy called stating patient is scheduled to start chemo on 08/27/19 but has not been scheduled for a PFT. Katie Scotton notified to schedule patient for PFT. Earliest appointment available is 08/28/19 at 3:30pm at [facility] due to patient requiring to have Covid test completed 3 days prior.  Dr. Alen Blew notified, ok to push patient's chemo start date to 09/03/19 to accommodate for PFT. Patient notified of change in schedule. Patient instructed to go to [facility] testing site to obtain Covid test on 08/24/19. Scheduling message sent to have chemo appointments changed. Patient told to expect a call from scheduling with updated dates and times of chemo infusions.  Patient verbalized understanding and will call office if he has any questions.

## 2019-08-23 ENCOUNTER — Telehealth: Payer: Self-pay | Admitting: Oncology

## 2019-08-23 NOTE — Telephone Encounter (Signed)
Scheduled appt per 10/20 sch message - pt aware of rescheduled appts

## 2019-08-24 ENCOUNTER — Other Ambulatory Visit (HOSPITAL_COMMUNITY)
Admission: RE | Admit: 2019-08-24 | Discharge: 2019-08-24 | Disposition: A | Discharge status: Home / Self Care | Payer: Commercial Managed Care - PPO | Source: Ambulatory Visit | Attending: Oncology | Admitting: Oncology

## 2019-08-24 DIAGNOSIS — Z01812 Encounter for preprocedural laboratory examination: Secondary | ICD-10-CM | POA: Diagnosis not present

## 2019-08-24 DIAGNOSIS — Z20828 Contact with and (suspected) exposure to other viral communicable diseases: Secondary | ICD-10-CM | POA: Insufficient documentation

## 2019-08-25 LAB — NOVEL CORONAVIRUS, NAA (HOSP ORDER, SEND-OUT TO REF LAB; TAT 18-24 HRS): SARS-CoV-2, NAA: NOT DETECTED

## 2019-08-27 ENCOUNTER — Ambulatory Visit: Payer: Commercial Managed Care - PPO

## 2019-08-27 ENCOUNTER — Other Ambulatory Visit: Payer: Commercial Managed Care - PPO

## 2019-08-28 ENCOUNTER — Ambulatory Visit (HOSPITAL_COMMUNITY)
Admission: RE | Admit: 2019-08-28 | Discharge: 2019-08-28 | Disposition: A | Discharge status: Home / Self Care | Payer: Commercial Managed Care - PPO | Source: Ambulatory Visit | Attending: Oncology | Admitting: Oncology

## 2019-08-28 ENCOUNTER — Ambulatory Visit: Payer: Commercial Managed Care - PPO

## 2019-08-28 ENCOUNTER — Other Ambulatory Visit: Payer: Self-pay

## 2019-08-28 DIAGNOSIS — C6291 Malignant neoplasm of right testis, unspecified whether descended or undescended: Secondary | ICD-10-CM | POA: Diagnosis not present

## 2019-08-28 LAB — PULMONARY FUNCTION TEST
DL/VA % pred: 71 %
DL/VA: 3.76 ml/min/mmHg/L
DLCO unc % pred: 81 %
DLCO unc: 21.48 ml/min/mmHg
FEF 25-75 Pre: 2.94 L/sec
FEF2575-%Pred-Pre: 70 %
FEV1-%Pred-Pre: 91 %
FEV1-Pre: 3.45 L
FEV1FVC-%Pred-Pre: 93 %
FEV6-%Pred-Pre: 100 %
FEV6-Pre: 4.44 L
FEV6FVC-%Pred-Pre: 100 %
FVC-%Pred-Pre: 100 %
FVC-Pre: 4.44 L
Pre FEV1/FVC ratio: 78 %
Pre FEV6/FVC Ratio: 100 %
RV % pred: 149 %
RV: 1.67 L
TLC % pred: 111 %
TLC: 6.12 L

## 2019-08-28 MED ORDER — ALBUTEROL SULFATE (2.5 MG/3ML) 0.083% IN NEBU: 2.5000 mg | INHALATION_SOLUTION | Freq: Once | RESPIRATORY_TRACT | Status: DC

## 2019-08-29 ENCOUNTER — Ambulatory Visit: Payer: Commercial Managed Care - PPO

## 2019-08-30 ENCOUNTER — Ambulatory Visit: Payer: Commercial Managed Care - PPO

## 2019-08-31 ENCOUNTER — Ambulatory Visit: Payer: Commercial Managed Care - PPO

## 2019-09-03 ENCOUNTER — Encounter: Payer: Self-pay | Admitting: Medical Oncology

## 2019-09-03 ENCOUNTER — Other Ambulatory Visit: Payer: Self-pay

## 2019-09-03 ENCOUNTER — Inpatient Hospital Stay: Payer: Commercial Managed Care - PPO

## 2019-09-03 ENCOUNTER — Inpatient Hospital Stay: Payer: Commercial Managed Care - PPO | Attending: Oncology

## 2019-09-03 VITALS — BP 118/68 | HR 61 | Temp 98.3°F | Resp 16

## 2019-09-03 DIAGNOSIS — Z23 Encounter for immunization: Secondary | ICD-10-CM | POA: Diagnosis not present

## 2019-09-03 DIAGNOSIS — C6291 Malignant neoplasm of right testis, unspecified whether descended or undescended: Secondary | ICD-10-CM

## 2019-09-03 DIAGNOSIS — R5383 Other fatigue: Secondary | ICD-10-CM | POA: Insufficient documentation

## 2019-09-03 DIAGNOSIS — F419 Anxiety disorder, unspecified: Secondary | ICD-10-CM | POA: Insufficient documentation

## 2019-09-03 DIAGNOSIS — Z5111 Encounter for antineoplastic chemotherapy: Secondary | ICD-10-CM | POA: Diagnosis present

## 2019-09-03 LAB — CMP (CANCER CENTER ONLY)
ALT: 15 U/L (ref 0–44)
AST: 17 U/L (ref 15–41)
Albumin: 4.6 g/dL (ref 3.5–5.0)
Alkaline Phosphatase: 84 U/L (ref 38–126)
Anion gap: 12 (ref 5–15)
BUN: 15 mg/dL (ref 6–20)
CO2: 25 mmol/L (ref 22–32)
Calcium: 9.4 mg/dL (ref 8.9–10.3)
Chloride: 105 mmol/L (ref 98–111)
Creatinine: 0.98 mg/dL (ref 0.61–1.24)
GFR, Est AFR Am: >60 mL/min (ref >60)
GFR, Estimated: >60 mL/min (ref >60)
Glucose, Bld: 120 mg/dL — ABNORMAL HIGH (ref 70–99)
Potassium: 4 mmol/L (ref 3.5–5.1)
Sodium: 142 mmol/L (ref 135–145)
Total Bilirubin: 0.4 mg/dL (ref 0.3–1.2)
Total Protein: 7.4 g/dL (ref 6.5–8.1)

## 2019-09-03 LAB — CBC WITH DIFFERENTIAL (CANCER CENTER ONLY)
Abs Immature Granulocytes: 0.02 10*3/uL (ref 0.00–0.07)
Basophils Absolute: 0 10*3/uL (ref 0.0–0.1)
Basophils Relative: 1 %
Eosinophils Absolute: 0.2 10*3/uL (ref 0.0–0.5)
Eosinophils Relative: 3 %
HCT: 44.4 % (ref 39.0–52.0)
Hemoglobin: 15.4 g/dL (ref 13.0–17.0)
Immature Granulocytes: 0 %
Lymphocytes Relative: 53 %
Lymphs Abs: 3.4 10*3/uL (ref 0.7–4.0)
MCH: 32 pg (ref 26.0–34.0)
MCHC: 34.7 g/dL (ref 30.0–36.0)
MCV: 92.1 fL (ref 80.0–100.0)
Monocytes Absolute: 0.7 10*3/uL (ref 0.1–1.0)
Monocytes Relative: 10 %
Neutro Abs: 2.2 10*3/uL (ref 1.7–7.7)
Neutrophils Relative %: 33 %
Platelet Count: 231 10*3/uL (ref 150–400)
RBC: 4.82 MIL/uL (ref 4.22–5.81)
RDW: 11.6 % (ref 11.5–15.5)
WBC Count: 6.5 10*3/uL (ref 4.0–10.5)
nRBC: 0 % (ref 0.0–0.2)

## 2019-09-03 LAB — MAGNESIUM: Magnesium: 1.9 mg/dL (ref 1.7–2.4)

## 2019-09-03 MED ORDER — POTASSIUM CHLORIDE 2 MEQ/ML IV SOLN
Freq: Once | INTRAVENOUS | Status: AC
  Administered 2019-09-03: 09:00:00 via INTRAVENOUS
  Filled 2019-09-03: qty 10

## 2019-09-03 MED ORDER — SODIUM CHLORIDE 0.9 % IV SOLN
20.0000 mg/m2 | Freq: Once | INTRAVENOUS | Status: AC
  Administered 2019-09-03: 14:00:00 32 mg via INTRAVENOUS
  Filled 2019-09-03: qty 32

## 2019-09-03 MED ORDER — SODIUM CHLORIDE 0.9 % IV SOLN
INTRAVENOUS | Status: DC
  Administered 2019-09-03: 11:00:00 via INTRAVENOUS
  Filled 2019-09-03: qty 250

## 2019-09-03 MED ORDER — INFLUENZA VAC A&B SA ADJ QUAD 0.5 ML IM PRSY: 0.5000 mL | PREFILLED_SYRINGE | Freq: Once | INTRAMUSCULAR | Status: DC

## 2019-09-03 MED ORDER — SODIUM CHLORIDE 0.9 % IV SOLN
Freq: Once | INTRAVENOUS | Status: AC
  Administered 2019-09-03: 11:00:00 via INTRAVENOUS
  Filled 2019-09-03: qty 5

## 2019-09-03 MED ORDER — INFLUENZA VAC SPLIT QUAD 0.5 ML IM SUSY
PREFILLED_SYRINGE | INTRAMUSCULAR | Status: AC
  Filled 2019-09-03: qty 0.5

## 2019-09-03 MED ORDER — PALONOSETRON HCL INJECTION 0.25 MG/5ML
INTRAVENOUS | Status: AC
  Filled 2019-09-03: qty 5

## 2019-09-03 MED ORDER — SODIUM CHLORIDE 0.9 % IV SOLN
100.0000 mg/m2 | Freq: Once | INTRAVENOUS | Status: AC
  Administered 2019-09-03: 160 mg via INTRAVENOUS
  Filled 2019-09-03: qty 8

## 2019-09-03 MED ORDER — INFLUENZA VAC SPLIT QUAD 0.5 ML IM SUSY
0.5000 mL | PREFILLED_SYRINGE | Freq: Once | INTRAMUSCULAR | Status: AC
  Administered 2019-09-03: 0.5 mL via INTRAMUSCULAR

## 2019-09-03 MED ORDER — SODIUM CHLORIDE 0.9 % IV SOLN
Freq: Once | INTRAVENOUS | Status: AC
  Administered 2019-09-03: 09:00:00 via INTRAVENOUS
  Filled 2019-09-03: qty 250

## 2019-09-03 MED ORDER — PALONOSETRON HCL INJECTION 0.25 MG/5ML
0.2500 mg | Freq: Once | INTRAVENOUS | Status: AC
  Administered 2019-09-03: 0.25 mg via INTRAVENOUS

## 2019-09-03 NOTE — Patient Instructions (Addendum)
[facility] Discharge Instructions for Patients Receiving Chemotherapy  Today you received the following chemotherapy agents CISPLATIN and  ETOPOSIDE To help prevent nausea and vomiting after your treatment, we encourage you to take your nausea medication :  prochlorperazine (COMPAZINE) 10 MG tablet 30 tablet 0 08/13/2019    Sig - Route: Take 1 tablet (10 mg total) by mouth every 6 (six) hours as needed for nausea or vomiting. - Oral   Sent to pharmacy as: prochlorperazine (COMPAZINE) 10 MG tablet   E-Prescribing Status: Receipt confirmed by pharmacy (08/13/2019 10:17 AM EDT)     If you develop nausea and vomiting that is not controlled by your nausea medication, call the clinic.   BELOW ARE SYMPTOMS THAT SHOULD BE REPORTED IMMEDIATELY:  *FEVER GREATER THAN 100.5 F  *CHILLS WITH OR WITHOUT FEVER  NAUSEA AND VOMITING THAT IS NOT CONTROLLED WITH YOUR NAUSEA MEDICATION  *UNUSUAL SHORTNESS OF BREATH  *UNUSUAL BRUISING OR BLEEDING  TENDERNESS IN MOUTH AND THROAT WITH OR WITHOUT PRESENCE OF ULCERS  *URINARY PROBLEMS  *BOWEL PROBLEMS  UNUSUAL RASH Items with * indicate a potential emergency and should be followed up as soon as possible.  Feel free to call the clinic should you have any questions or concerns. The clinic phone number is [phone].  Please show the [place] at check-in to the Emergency Department and triage nurse.  Etoposide, VP-16 capsules What is this medicine? ETOPOSIDE, VP-16 (e toe POE side) is a chemotherapy drug. It is used to treat small cell lung cancer and other cancers. This medicine may be used for other purposes; ask your health care provider or pharmacist if you have questions. COMMON BRAND NAME(S): VePesid What should I tell my health care provider before I take this medicine? They need to know if you have any of these conditions:  infection  kidney disease  liver disease  low blood counts, like low white cell,  platelet, or red cell counts  an unusual or allergic reaction to etoposide, other medicines, foods, dyes, or preservatives  pregnant or trying to get pregnant  breast-feeding How should I use this medicine? Take this medicine by mouth with a glass of water. Follow the directions on the prescription label. Do not open, crush, or chew the capsules. It is advisable to wear gloves when handling this medicine. Take your medicine at regular intervals. Do not take it more often than directed. Do not stop taking except on your doctor's advice. Talk to your pediatrician regarding the use of this medicine in children. Special care may be needed. Overdosage: If you think you have taken too much of this medicine contact a poison control center or emergency room at once. NOTE: This medicine is only for you. Do not share this medicine with others. What if I miss a dose? If you miss a dose, take it as soon as you can. If it is almost time for your next dose, take only that dose. Do not take double or extra doses. What may interact with this medicine?  aspirin  certain medications for seizures like carbamazepine, phenobarbital, phenytoin, valproic acid  cyclosporine  levamisole  valproic acid  warfarin This list may not describe all possible interactions. Give your health care provider a list of all the medicines, herbs, non-prescription drugs, or dietary supplements you use. Also tell them if you smoke, drink alcohol, or use illegal drugs. Some items may interact with your medicine. What should I watch for while using this medicine? Visit your  doctor for checks on your progress. This drug may make you feel generally unwell. This is not uncommon, as chemotherapy can affect healthy cells as well as cancer cells. Report any side effects. Continue your course of treatment even though you feel ill unless your doctor tells you to stop. In some cases, you may be given additional medicines to help with side  effects. Follow all directions for their use. Call your doctor or health care professional for advice if you get a fever, chills or sore throat, or other symptoms of a cold or flu. Do not treat yourself. This drug decreases your body's ability to fight infections. Try to avoid being around people who are sick. This medicine may increase your risk to bruise or bleed. Call your doctor or health care professional if you notice any unusual bleeding. Talk to your doctor about your risk of cancer. You may be more at risk for certain types of cancers if you take this medicine. Do not become pregnant while taking this medicine or for at least 6 months after stopping it. Women should inform their doctor if they wish to become pregnant or think they might be pregnant. Women of child-bearing potential will need to have a negative pregnancy test before starting this medicine. There is a potential for serious side effects to an unborn child. Talk to your health care professional or pharmacist for more information. Do not breast-feed an infant while taking this medicine. Men must use a latex condom during sexual contact with a woman while taking this medicine and for at least 4 months after stopping it. A latex condom is needed even if you have had a vasectomy. Contact your doctor right away if your partner becomes pregnant. Do not donate sperm while taking this medicine and for 4 months after you stop taking this medicine. Men should inform their doctors if they wish to father a child. This medicine may lower sperm counts. What side effects may I notice from receiving this medicine? Side effects that you should report to your doctor or health care professional as soon as possible:  allergic reactions like skin rash, itching or hives, swelling of the face, lips, or tongue  low blood counts - this medicine may decrease the number of white blood cells, red blood cells and platelets. You may be at increased risk for  infections and bleeding.  signs of infection - fever or chills, cough, sore throat, pain or difficulty passing urine  signs of decreased platelets or bleeding - bruising, pinpoint red spots on the skin, black, tarry stools, blood in the urine  signs of decreased red blood cells - unusually weak or tired, fainting spells, lightheadedness  breathing problems  changes in vision  mouth or throat sores or ulcers  pain, tingling, numbness in the hands or feet  redness, blistering, peeling or loosening of the skin, including inside the mouth  seizures  vomiting Side effects that usually do not require medical attention (report to your doctor or health care professional if they continue or are bothersome):  change in taste  diarrhea  hair loss  nausea  stomach pain This list may not describe all possible side effects. Call your doctor for medical advice about side effects. You may report side effects to FDA at 1-800-FDA-1088. Where should I keep my medicine? Keep out of the reach of children. Store in a refrigerator between 2 and 8 degrees C (36 and 46 degrees F). Do not freeze. Throw away any unused medicine after  the expiration date. NOTE: This sheet is a summary. It may not cover all possible information. If you have questions about this medicine, talk to your doctor, pharmacist, or health care provider.  2020 Elsevier/Gold Standard (2015-10-10 11:49:52) Cisplatin injection What is this medicine? CISPLATIN (SIS pla tin) is a chemotherapy drug. It targets fast dividing cells, like cancer cells, and causes these cells to die. This medicine is used to treat many types of cancer like bladder, ovarian, and testicular cancers. This medicine may be used for other purposes; ask your health care provider or pharmacist if you have questions. COMMON BRAND NAME(S): Platinol, Platinol -AQ What should I tell my health care provider before I take this medicine? They need to know if you have  any of these conditions:  blood disorders  hearing problems  kidney disease  recent or ongoing radiation therapy  an unusual or allergic reaction to cisplatin, carboplatin, other chemotherapy, other medicines, foods, dyes, or preservatives  pregnant or trying to get pregnant  breast-feeding How should I use this medicine? This drug is given as an infusion into a vein. It is administered in a hospital or clinic by a specially trained health care professional. Talk to your pediatrician regarding the use of this medicine in children. Special care may be needed. Overdosage: If you think you have taken too much of this medicine contact a poison control center or emergency room at once. NOTE: This medicine is only for you. Do not share this medicine with others. What if I miss a dose? It is important not to miss a dose. Call your doctor or health care professional if you are unable to keep an appointment. What may interact with this medicine?  dofetilide  foscarnet  medicines for seizures  medicines to increase blood counts like filgrastim, pegfilgrastim, sargramostim  probenecid  pyridoxine used with altretamine  rituximab  some antibiotics like amikacin, gentamicin, neomycin, polymyxin B, streptomycin, tobramycin  sulfinpyrazone  vaccines  zalcitabine Talk to your doctor or health care professional before taking any of these medicines:  acetaminophen  aspirin  ibuprofen  ketoprofen  naproxen This list may not describe all possible interactions. Give your health care provider a list of all the medicines, herbs, non-prescription drugs, or dietary supplements you use. Also tell them if you smoke, drink alcohol, or use illegal drugs. Some items may interact with your medicine. What should I watch for while using this medicine? Your condition will be monitored carefully while you are receiving this medicine. You will need important blood work done while you are taking  this medicine. This drug may make you feel generally unwell. This is not uncommon, as chemotherapy can affect healthy cells as well as cancer cells. Report any side effects. Continue your course of treatment even though you feel ill unless your doctor tells you to stop. In some cases, you may be given additional medicines to help with side effects. Follow all directions for their use. Call your doctor or health care professional for advice if you get a fever, chills or sore throat, or other symptoms of a cold or flu. Do not treat yourself. This drug decreases your body's ability to fight infections. Try to avoid being around people who are sick. This medicine may increase your risk to bruise or bleed. Call your doctor or health care professional if you notice any unusual bleeding. Be careful brushing and flossing your teeth or using a toothpick because you may get an infection or bleed more easily. If you have  any dental work done, tell your dentist you are receiving this medicine. Avoid taking products that contain aspirin, acetaminophen, ibuprofen, naproxen, or ketoprofen unless instructed by your doctor. These medicines may hide a fever. Do not become pregnant while taking this medicine. Women should inform their doctor if they wish to become pregnant or think they might be pregnant. There is a potential for serious side effects to an unborn child. Talk to your health care professional or pharmacist for more information. Do not breast-feed an infant while taking this medicine. Drink fluids as directed while you are taking this medicine. This will help protect your kidneys. Call your doctor or health care professional if you get diarrhea. Do not treat yourself. What side effects may I notice from receiving this medicine? Side effects that you should report to your doctor or health care professional as soon as possible:  allergic reactions like skin rash, itching or hives, swelling of the face, lips,  or tongue  signs of infection - fever or chills, cough, sore throat, pain or difficulty passing urine  signs of decreased platelets or bleeding - bruising, pinpoint red spots on the skin, black, tarry stools, nosebleeds  signs of decreased red blood cells - unusually weak or tired, fainting spells, lightheadedness  breathing problems  changes in hearing  gout pain  low blood counts - This drug may decrease the number of white blood cells, red blood cells and platelets. You may be at increased risk for infections and bleeding.  nausea and vomiting  pain, swelling, redness or irritation at the injection site  pain, tingling, numbness in the hands or feet  problems with balance, movement  trouble passing urine or change in the amount of urine Side effects that usually do not require medical attention (report to your doctor or health care professional if they continue or are bothersome):  changes in vision  loss of appetite  metallic taste in the mouth or changes in taste This list may not describe all possible side effects. Call your doctor for medical advice about side effects. You may report side effects to FDA at 1-800-FDA-1088. Where should I keep my medicine? This drug is given in a hospital or clinic and will not be stored at home. NOTE: This sheet is a summary. It may not cover all possible information. If you have questions about this medicine, talk to your doctor, pharmacist, or health care provider.  2020 Elsevier/Gold Standard (2008-01-23 14:40:54)

## 2019-09-04 ENCOUNTER — Other Ambulatory Visit: Payer: Self-pay

## 2019-09-04 ENCOUNTER — Ambulatory Visit: Payer: Commercial Managed Care - PPO

## 2019-09-04 ENCOUNTER — Inpatient Hospital Stay: Payer: Commercial Managed Care - PPO

## 2019-09-04 VITALS — BP 111/72 | HR 79 | Temp 98.7°F | Resp 16

## 2019-09-04 DIAGNOSIS — C6291 Malignant neoplasm of right testis, unspecified whether descended or undescended: Secondary | ICD-10-CM

## 2019-09-04 DIAGNOSIS — Z5111 Encounter for antineoplastic chemotherapy: Secondary | ICD-10-CM | POA: Diagnosis not present

## 2019-09-04 MED ORDER — POTASSIUM CHLORIDE 2 MEQ/ML IV SOLN
Freq: Once | INTRAVENOUS | Status: AC
  Administered 2019-09-04: 10:00:00 via INTRAVENOUS
  Filled 2019-09-04: qty 10

## 2019-09-04 MED ORDER — SODIUM CHLORIDE 0.9 % IV SOLN
100.0000 mg/m2 | Freq: Once | INTRAVENOUS | Status: AC
  Administered 2019-09-04: 13:00:00 160 mg via INTRAVENOUS
  Filled 2019-09-04: qty 8

## 2019-09-04 MED ORDER — SODIUM CHLORIDE 0.9 % IV SOLN
30.0000 [IU] | Freq: Once | INTRAVENOUS | Status: AC
  Administered 2019-09-04: 30 [IU] via INTRAVENOUS
  Filled 2019-09-04: qty 10

## 2019-09-04 MED ORDER — SODIUM CHLORIDE 0.9 % IV SOLN
Freq: Once | INTRAVENOUS | Status: AC
  Administered 2019-09-04: 09:00:00 via INTRAVENOUS
  Filled 2019-09-04: qty 250

## 2019-09-04 MED ORDER — DEXAMETHASONE SODIUM PHOSPHATE 10 MG/ML IJ SOLN
10.0000 mg | Freq: Once | INTRAMUSCULAR | Status: AC
  Administered 2019-09-04: 12:00:00 10 mg via INTRAVENOUS

## 2019-09-04 MED ORDER — HEPARIN SOD (PORK) LOCK FLUSH 100 UNIT/ML IV SOLN
500.0000 [IU] | Freq: Once | INTRAVENOUS | Status: DC | PRN
  Filled 2019-09-04: qty 5

## 2019-09-04 MED ORDER — SODIUM CHLORIDE 0.9% FLUSH
10.0000 mL | INTRAVENOUS | Status: DC | PRN
  Filled 2019-09-04: qty 10

## 2019-09-04 MED ORDER — DEXAMETHASONE SODIUM PHOSPHATE 10 MG/ML IJ SOLN
INTRAMUSCULAR | Status: AC
  Filled 2019-09-04: qty 1

## 2019-09-04 MED ORDER — SODIUM CHLORIDE 0.9 % IV SOLN: 10.0000 mg | Freq: Once | INTRAVENOUS | Status: DC

## 2019-09-04 MED ORDER — SODIUM CHLORIDE 0.9 % IV SOLN
20.0000 mg/m2 | Freq: Once | INTRAVENOUS | Status: AC
  Administered 2019-09-04: 32 mg via INTRAVENOUS
  Filled 2019-09-04: qty 32

## 2019-09-04 NOTE — Patient Instructions (Signed)
[facility] Discharge Instructions for Patients Receiving Chemotherapy  Today you received the following chemotherapy agents: bleomycin, etoposide, cisplatin  To help prevent nausea and vomiting after your treatment, we encourage you to take your nausea medication as prescribed by your physician.    If you develop nausea and vomiting that is not controlled by your nausea medication, call the clinic.   BELOW ARE SYMPTOMS THAT SHOULD BE REPORTED IMMEDIATELY:  *FEVER GREATER THAN 100.5 F  *CHILLS WITH OR WITHOUT FEVER  NAUSEA AND VOMITING THAT IS NOT CONTROLLED WITH YOUR NAUSEA MEDICATION  *UNUSUAL SHORTNESS OF BREATH  *UNUSUAL BRUISING OR BLEEDING  TENDERNESS IN MOUTH AND THROAT WITH OR WITHOUT PRESENCE OF ULCERS  *URINARY PROBLEMS  *BOWEL PROBLEMS  UNUSUAL RASH Items with * indicate a potential emergency and should be followed up as soon as possible.  Feel free to call the clinic should you have any questions or concerns. The clinic phone number is [phone].  Please show the [place] at check-in to the Emergency Department and triage nurse.

## 2019-09-04 NOTE — Progress Notes (Signed)
Patient refused IV removal stating it was very difficult to obtain access today. He be cautious and return tomorrow morning for treatment. Patient advised to keep dressing clean and dry. Patient verbalized an understanding.

## 2019-09-05 ENCOUNTER — Encounter (HOSPITAL_COMMUNITY): Payer: Commercial Managed Care - PPO

## 2019-09-05 ENCOUNTER — Inpatient Hospital Stay: Payer: Commercial Managed Care - PPO

## 2019-09-05 ENCOUNTER — Other Ambulatory Visit: Payer: Self-pay

## 2019-09-05 VITALS — BP 124/90 | HR 72 | Temp 98.9°F | Resp 16

## 2019-09-05 DIAGNOSIS — C6291 Malignant neoplasm of right testis, unspecified whether descended or undescended: Secondary | ICD-10-CM

## 2019-09-05 DIAGNOSIS — Z5111 Encounter for antineoplastic chemotherapy: Secondary | ICD-10-CM | POA: Diagnosis not present

## 2019-09-05 MED ORDER — POTASSIUM CHLORIDE 2 MEQ/ML IV SOLN
Freq: Once | INTRAVENOUS | Status: AC
  Administered 2019-09-05: 09:00:00 via INTRAVENOUS
  Filled 2019-09-05: qty 10

## 2019-09-05 MED ORDER — PALONOSETRON HCL INJECTION 0.25 MG/5ML
0.2500 mg | Freq: Once | INTRAVENOUS | Status: AC
  Administered 2019-09-05: 0.25 mg via INTRAVENOUS

## 2019-09-05 MED ORDER — SODIUM CHLORIDE 0.9 % IV SOLN
100.0000 mg/m2 | Freq: Once | INTRAVENOUS | Status: AC
  Administered 2019-09-05: 160 mg via INTRAVENOUS
  Filled 2019-09-05: qty 8

## 2019-09-05 MED ORDER — SODIUM CHLORIDE 0.9 % IV SOLN
20.0000 mg/m2 | Freq: Once | INTRAVENOUS | Status: AC
  Administered 2019-09-05: 32 mg via INTRAVENOUS
  Filled 2019-09-05: qty 32

## 2019-09-05 MED ORDER — PALONOSETRON HCL INJECTION 0.25 MG/5ML
INTRAVENOUS | Status: AC
  Filled 2019-09-05: qty 5

## 2019-09-05 MED ORDER — SODIUM CHLORIDE 0.9 % IV SOLN
Freq: Once | INTRAVENOUS | Status: AC
  Administered 2019-09-05: 11:00:00 via INTRAVENOUS
  Filled 2019-09-05: qty 5

## 2019-09-05 MED ORDER — SODIUM CHLORIDE 0.9 % IV SOLN
Freq: Once | INTRAVENOUS | Status: AC
  Administered 2019-09-05: 08:00:00 via INTRAVENOUS
  Filled 2019-09-05: qty 250

## 2019-09-05 NOTE — Patient Instructions (Signed)
[facility] Discharge Instructions for Patients Receiving Chemotherapy  Today you received the following chemotherapy agents: bleomycin, etoposide, cisplatin  To help prevent nausea and vomiting after your treatment, we encourage you to take your nausea medication as prescribed by your physician.    If you develop nausea and vomiting that is not controlled by your nausea medication, call the clinic.   BELOW ARE SYMPTOMS THAT SHOULD BE REPORTED IMMEDIATELY:  *FEVER GREATER THAN 100.5 F  *CHILLS WITH OR WITHOUT FEVER  NAUSEA AND VOMITING THAT IS NOT CONTROLLED WITH YOUR NAUSEA MEDICATION  *UNUSUAL SHORTNESS OF BREATH  *UNUSUAL BRUISING OR BLEEDING  TENDERNESS IN MOUTH AND THROAT WITH OR WITHOUT PRESENCE OF ULCERS  *URINARY PROBLEMS  *BOWEL PROBLEMS  UNUSUAL RASH Items with * indicate a potential emergency and should be followed up as soon as possible.  Feel free to call the clinic should you have any questions or concerns. The clinic phone number is [phone].  Please show the [facility] at check-in to the Emergency Department and triage nurse.

## 2019-09-06 ENCOUNTER — Other Ambulatory Visit: Payer: Self-pay

## 2019-09-06 ENCOUNTER — Inpatient Hospital Stay: Payer: Commercial Managed Care - PPO

## 2019-09-06 VITALS — BP 122/69 | HR 73 | Temp 98.5°F | Resp 16

## 2019-09-06 DIAGNOSIS — C6291 Malignant neoplasm of right testis, unspecified whether descended or undescended: Secondary | ICD-10-CM

## 2019-09-06 DIAGNOSIS — Z5111 Encounter for antineoplastic chemotherapy: Secondary | ICD-10-CM | POA: Diagnosis not present

## 2019-09-06 MED ORDER — SODIUM CHLORIDE 0.9 % IV SOLN
Freq: Once | INTRAVENOUS | Status: AC
  Administered 2019-09-06: 08:00:00 via INTRAVENOUS
  Filled 2019-09-06: qty 250

## 2019-09-06 MED ORDER — DEXAMETHASONE SODIUM PHOSPHATE 10 MG/ML IJ SOLN
INTRAMUSCULAR | Status: AC
  Filled 2019-09-06: qty 1

## 2019-09-06 MED ORDER — POTASSIUM CHLORIDE 2 MEQ/ML IV SOLN
Freq: Once | INTRAVENOUS | Status: AC
  Administered 2019-09-06: 09:00:00 via INTRAVENOUS
  Filled 2019-09-06: qty 10

## 2019-09-06 MED ORDER — SODIUM CHLORIDE 0.9 % IV SOLN
20.0000 mg/m2 | Freq: Once | INTRAVENOUS | Status: AC
  Administered 2019-09-06: 32 mg via INTRAVENOUS
  Filled 2019-09-06: qty 32

## 2019-09-06 MED ORDER — SODIUM CHLORIDE 0.9 % IV SOLN
100.0000 mg/m2 | Freq: Once | INTRAVENOUS | Status: AC
  Administered 2019-09-06: 160 mg via INTRAVENOUS
  Filled 2019-09-06: qty 8

## 2019-09-06 MED ORDER — DEXAMETHASONE SODIUM PHOSPHATE 10 MG/ML IJ SOLN
10.0000 mg | Freq: Once | INTRAMUSCULAR | Status: AC
  Administered 2019-09-06: 10 mg via INTRAVENOUS

## 2019-09-06 NOTE — Patient Instructions (Signed)
[facility] Discharge Instructions for Patients Receiving Chemotherapy  Today you received the following chemotherapy agents:  etoposide, cisplatin  To help prevent nausea and vomiting after your treatment, we encourage you to take your nausea medication as prescribed by your physician.    If you develop nausea and vomiting that is not controlled by your nausea medication, call the clinic.   BELOW ARE SYMPTOMS THAT SHOULD BE REPORTED IMMEDIATELY:  *FEVER GREATER THAN 100.5 F  *CHILLS WITH OR WITHOUT FEVER  NAUSEA AND VOMITING THAT IS NOT CONTROLLED WITH YOUR NAUSEA MEDICATION  *UNUSUAL SHORTNESS OF BREATH  *UNUSUAL BRUISING OR BLEEDING  TENDERNESS IN MOUTH AND THROAT WITH OR WITHOUT PRESENCE OF ULCERS  *URINARY PROBLEMS  *BOWEL PROBLEMS  UNUSUAL RASH Items with * indicate a potential emergency and should be followed up as soon as possible.  Feel free to call the clinic should you have any questions or concerns. The clinic phone number is [phone].  Please show the [place] at check-in to the Emergency Department and triage nurse.

## 2019-09-07 ENCOUNTER — Inpatient Hospital Stay: Payer: Commercial Managed Care - PPO

## 2019-09-07 ENCOUNTER — Ambulatory Visit: Payer: Commercial Managed Care - PPO

## 2019-09-07 ENCOUNTER — Other Ambulatory Visit: Payer: Self-pay

## 2019-09-07 VITALS — BP 126/77 | HR 89 | Temp 98.7°F | Resp 16

## 2019-09-07 DIAGNOSIS — C6291 Malignant neoplasm of right testis, unspecified whether descended or undescended: Secondary | ICD-10-CM

## 2019-09-07 DIAGNOSIS — Z5111 Encounter for antineoplastic chemotherapy: Secondary | ICD-10-CM | POA: Diagnosis not present

## 2019-09-07 MED ORDER — POTASSIUM CHLORIDE 2 MEQ/ML IV SOLN
Freq: Once | INTRAVENOUS | Status: AC
  Administered 2019-09-07: 08:00:00 via INTRAVENOUS
  Filled 2019-09-07: qty 10

## 2019-09-07 MED ORDER — SODIUM CHLORIDE 0.9 % IV SOLN
Freq: Once | INTRAVENOUS | Status: AC
  Administered 2019-09-07: 10:00:00 via INTRAVENOUS
  Filled 2019-09-07: qty 5

## 2019-09-07 MED ORDER — PALONOSETRON HCL INJECTION 0.25 MG/5ML
INTRAVENOUS | Status: AC
  Filled 2019-09-07: qty 5

## 2019-09-07 MED ORDER — SODIUM CHLORIDE 0.9 % IV SOLN
Freq: Once | INTRAVENOUS | Status: AC
  Administered 2019-09-07: 08:00:00 via INTRAVENOUS
  Filled 2019-09-07: qty 250

## 2019-09-07 MED ORDER — SODIUM CHLORIDE 0.9 % IV SOLN
100.0000 mg/m2 | Freq: Once | INTRAVENOUS | Status: AC
  Administered 2019-09-07: 12:00:00 160 mg via INTRAVENOUS
  Filled 2019-09-07: qty 8

## 2019-09-07 MED ORDER — SODIUM CHLORIDE 0.9 % IV SOLN
20.0000 mg/m2 | Freq: Once | INTRAVENOUS | Status: AC
  Administered 2019-09-07: 11:00:00 32 mg via INTRAVENOUS
  Filled 2019-09-07: qty 32

## 2019-09-07 MED ORDER — PALONOSETRON HCL INJECTION 0.25 MG/5ML
0.2500 mg | Freq: Once | INTRAVENOUS | Status: AC
  Administered 2019-09-07: 0.25 mg via INTRAVENOUS

## 2019-09-07 NOTE — Patient Instructions (Signed)
[facility]charge Instructions for Patients Receiving Chemotherapy  Today you received the following chemotherapy agents:  etoposide, cisplatin  To help prevent nausea and vomiting after your treatment, we encourage you to take your nausea medication as prescribed by your physician.    If you develop nausea and vomiting that is not controlled by your nausea medication, call the clinic.   BELOW ARE SYMPTOMS THAT SHOULD BE REPORTED IMMEDIATELY:  *FEVER GREATER THAN 100.5 F  *CHILLS WITH OR WITHOUT FEVER  NAUSEA AND VOMITING THAT IS NOT CONTROLLED WITH YOUR NAUSEA MEDICATION  *UNUSUAL SHORTNESS OF BREATH  *UNUSUAL BRUISING OR BLEEDING  TENDERNESS IN MOUTH AND THROAT WITH OR WITHOUT PRESENCE OF ULCERS  *URINARY PROBLEMS  *BOWEL PROBLEMS  UNUSUAL RASH Items with * indicate a potential emergency and should be followed up as soon as possible.  Feel free to call the clinic should you have any questions or concerns. The clinic phone number is [phone].  Please show the [facility] at check-in to the Emergency Department and triage nurse.

## 2019-09-11 ENCOUNTER — Ambulatory Visit: Payer: Commercial Managed Care - PPO

## 2019-09-11 ENCOUNTER — Inpatient Hospital Stay (HOSPITAL_BASED_OUTPATIENT_CLINIC_OR_DEPARTMENT_OTHER): Payer: Commercial Managed Care - PPO | Admitting: Medical

## 2019-09-11 ENCOUNTER — Ambulatory Visit: Payer: Commercial Managed Care - PPO | Admitting: Oncology

## 2019-09-11 ENCOUNTER — Other Ambulatory Visit: Payer: Commercial Managed Care - PPO

## 2019-09-11 ENCOUNTER — Inpatient Hospital Stay: Payer: Commercial Managed Care - PPO

## 2019-09-11 ENCOUNTER — Other Ambulatory Visit: Payer: Self-pay | Admitting: Medical

## 2019-09-11 ENCOUNTER — Other Ambulatory Visit: Payer: Self-pay

## 2019-09-11 VITALS — BP 108/72 | HR 98 | Temp 98.0°F | Resp 18

## 2019-09-11 DIAGNOSIS — C6291 Malignant neoplasm of right testis, unspecified whether descended or undescended: Secondary | ICD-10-CM

## 2019-09-11 DIAGNOSIS — Z5111 Encounter for antineoplastic chemotherapy: Secondary | ICD-10-CM | POA: Diagnosis not present

## 2019-09-11 DIAGNOSIS — F419 Anxiety disorder, unspecified: Secondary | ICD-10-CM

## 2019-09-11 MED ORDER — LORAZEPAM 0.5 MG PO TABS: ORAL_TABLET | ORAL | 0 refills | Status: DC

## 2019-09-11 MED ORDER — SODIUM CHLORIDE 0.9 % IV SOLN
30.0000 [IU] | Freq: Once | INTRAVENOUS | Status: AC
  Administered 2019-09-11: 30 [IU] via INTRAVENOUS
  Filled 2019-09-11: qty 10

## 2019-09-11 MED ORDER — SODIUM CHLORIDE 0.9 % IV SOLN
Freq: Once | INTRAVENOUS | Status: AC
  Administered 2019-09-11: 16:00:00 via INTRAVENOUS
  Filled 2019-09-11: qty 250

## 2019-09-11 MED ORDER — PROCHLORPERAZINE MALEATE 10 MG PO TABS
ORAL_TABLET | ORAL | Status: AC
  Filled 2019-09-11: qty 1

## 2019-09-11 MED ORDER — PROCHLORPERAZINE MALEATE 10 MG PO TABS
10.0000 mg | ORAL_TABLET | Freq: Once | ORAL | Status: AC
  Administered 2019-09-11: 10 mg via ORAL

## 2019-09-11 NOTE — Progress Notes (Signed)
09/11/19  Confirmed no labs to be drawn today.  T.O.  Dr Reynaldo Minium, RN/[facility] [place] Ronnald Ramp, PharmD

## 2019-09-11 NOTE — Patient Instructions (Signed)
[facility] Discharge Instructions for Patients Receiving Chemotherapy  Today you received the following chemotherapy agents bleomycin.  To help prevent nausea and vomiting after your treatment, we encourage you to take your nausea medication.   If you develop nausea and vomiting that is not controlled by your nausea medication, call the clinic.   BELOW ARE SYMPTOMS THAT SHOULD BE REPORTED IMMEDIATELY:  *FEVER GREATER THAN 100.5 F  *CHILLS WITH OR WITHOUT FEVER  NAUSEA AND VOMITING THAT IS NOT CONTROLLED WITH YOUR NAUSEA MEDICATION  *UNUSUAL SHORTNESS OF BREATH  *UNUSUAL BRUISING OR BLEEDING  TENDERNESS IN MOUTH AND THROAT WITH OR WITHOUT PRESENCE OF ULCERS  *URINARY PROBLEMS  *BOWEL PROBLEMS  UNUSUAL RASH Items with * indicate a potential emergency and should be followed up as soon as possible.  Feel free to call the clinic should you have any questions or concerns. The clinic phone number is [phone].  Please show the [place] at check-in to the Emergency Department and triage nurse.

## 2019-09-12 NOTE — Progress Notes (Signed)
Ms. referral was seen in the infusion room as he was receiving chemotherapy today.  He has had significant anxiety which is included diaphoresis when he is getting his IV started with his prior treatments.  He has 1 additional treatment upcoming.  After discussion with him he was agreeable to be given a prescription for Ativan 0.5 mg with instructions to take 1 tablet 1 hour before his treatment.  He was told that he could repeat this dose if needed.  A total of 2 tablets were dispensed with no refills.  Sandi Mealy, MHS, PA-C Physician Assistant

## 2019-09-18 ENCOUNTER — Inpatient Hospital Stay: Payer: Commercial Managed Care - PPO

## 2019-09-18 ENCOUNTER — Telehealth: Payer: Self-pay | Admitting: Oncology

## 2019-09-18 ENCOUNTER — Telehealth: Payer: Self-pay

## 2019-09-18 ENCOUNTER — Inpatient Hospital Stay (HOSPITAL_BASED_OUTPATIENT_CLINIC_OR_DEPARTMENT_OTHER): Payer: Commercial Managed Care - PPO | Admitting: Oncology

## 2019-09-18 ENCOUNTER — Other Ambulatory Visit: Payer: Self-pay

## 2019-09-18 VITALS — BP 116/77 | HR 78 | Temp 98.5°F | Resp 18 | Ht 63.0 in | Wt 134.2 lb

## 2019-09-18 DIAGNOSIS — C6291 Malignant neoplasm of right testis, unspecified whether descended or undescended: Secondary | ICD-10-CM | POA: Diagnosis not present

## 2019-09-18 DIAGNOSIS — Z5111 Encounter for antineoplastic chemotherapy: Secondary | ICD-10-CM | POA: Diagnosis not present

## 2019-09-18 LAB — CMP (CANCER CENTER ONLY)
ALT: 20 U/L (ref 0–44)
AST: 15 U/L (ref 15–41)
Albumin: 4.1 g/dL (ref 3.5–5.0)
Alkaline Phosphatase: 63 U/L (ref 38–126)
Anion gap: 12 (ref 5–15)
BUN: 12 mg/dL (ref 6–20)
CO2: 26 mmol/L (ref 22–32)
Calcium: 8.9 mg/dL (ref 8.9–10.3)
Chloride: 105 mmol/L (ref 98–111)
Creatinine: 0.97 mg/dL (ref 0.61–1.24)
GFR, Est AFR Am: >60 mL/min (ref >60)
GFR, Estimated: >60 mL/min (ref >60)
Glucose, Bld: 100 mg/dL — ABNORMAL HIGH (ref 70–99)
Potassium: 4.7 mmol/L (ref 3.5–5.1)
Sodium: 143 mmol/L (ref 135–145)
Total Bilirubin: 0.3 mg/dL (ref 0.3–1.2)
Total Protein: 6.7 g/dL (ref 6.5–8.1)

## 2019-09-18 LAB — CBC WITH DIFFERENTIAL (CANCER CENTER ONLY)
Abs Immature Granulocytes: 0 10*3/uL (ref 0.00–0.07)
Basophils Absolute: 0 10*3/uL (ref 0.0–0.1)
Basophils Relative: 1 %
Eosinophils Absolute: 0 10*3/uL (ref 0.0–0.5)
Eosinophils Relative: 1 %
HCT: 34.3 % — ABNORMAL LOW (ref 39.0–52.0)
Hemoglobin: 11.5 g/dL — ABNORMAL LOW (ref 13.0–17.0)
Immature Granulocytes: 0 %
Lymphocytes Relative: 79 %
Lymphs Abs: 1.4 10*3/uL (ref 0.7–4.0)
MCH: 31.7 pg (ref 26.0–34.0)
MCHC: 33.5 g/dL (ref 30.0–36.0)
MCV: 94.5 fL (ref 80.0–100.0)
Monocytes Absolute: 0.2 10*3/uL (ref 0.1–1.0)
Monocytes Relative: 12 %
Neutro Abs: 0.1 10*3/uL — CL (ref 1.7–7.7)
Neutrophils Relative %: 7 %
Platelet Count: 129 10*3/uL — ABNORMAL LOW (ref 150–400)
RBC: 3.63 MIL/uL — ABNORMAL LOW (ref 4.22–5.81)
RDW: 11.5 % (ref 11.5–15.5)
WBC Count: 1.8 10*3/uL — ABNORMAL LOW (ref 4.0–10.5)
nRBC: 0 % (ref 0.0–0.2)

## 2019-09-18 LAB — MAGNESIUM: Magnesium: 1.9 mg/dL (ref 1.7–2.4)

## 2019-09-18 MED ORDER — SODIUM CHLORIDE 0.9 % IV SOLN
Freq: Once | INTRAVENOUS | Status: AC
  Administered 2019-09-18: 15:00:00 via INTRAVENOUS
  Filled 2019-09-18: qty 250

## 2019-09-18 MED ORDER — PROCHLORPERAZINE MALEATE 10 MG PO TABS
ORAL_TABLET | ORAL | Status: AC
  Filled 2019-09-18: qty 1

## 2019-09-18 MED ORDER — SODIUM CHLORIDE 0.9 % IV SOLN
30.0000 [IU] | Freq: Once | INTRAVENOUS | Status: AC
  Administered 2019-09-18: 30 [IU] via INTRAVENOUS
  Filled 2019-09-18: qty 10

## 2019-09-18 MED ORDER — PROCHLORPERAZINE MALEATE 10 MG PO TABS
10.0000 mg | ORAL_TABLET | Freq: Once | ORAL | Status: AC
  Administered 2019-09-18: 10 mg via ORAL

## 2019-09-18 NOTE — Telephone Encounter (Signed)
Scheduled appt per 11/17 los.  Spoke with pt and he is aware of his appt date and time.

## 2019-09-18 NOTE — Patient Instructions (Signed)
[facility] Discharge Instructions for Patients Receiving Chemotherapy  Today you received the following chemotherapy agents bleomycin.  To help prevent nausea and vomiting after your treatment, we encourage you to take your nausea medication.   If you develop nausea and vomiting that is not controlled by your nausea medication, call the clinic.   BELOW ARE SYMPTOMS THAT SHOULD BE REPORTED IMMEDIATELY:  *FEVER GREATER THAN 100.5 F  *CHILLS WITH OR WITHOUT FEVER  NAUSEA AND VOMITING THAT IS NOT CONTROLLED WITH YOUR NAUSEA MEDICATION  *UNUSUAL SHORTNESS OF BREATH  *UNUSUAL BRUISING OR BLEEDING  TENDERNESS IN MOUTH AND THROAT WITH OR WITHOUT PRESENCE OF ULCERS  *URINARY PROBLEMS  *BOWEL PROBLEMS  UNUSUAL RASH Items with * indicate a potential emergency and should be followed up as soon as possible.  Feel free to call the clinic should you have any questions or concerns. The clinic phone number is [phone].  Please show the [facility] at check-in to the Emergency Department and triage nurse.

## 2019-09-18 NOTE — Telephone Encounter (Signed)
Per Dr. Alen Blew, it is ok to treat today with ANC 0.1. Infusion RN made aware.

## 2019-09-18 NOTE — Progress Notes (Signed)
OK to treat pt today with bleomycin and todays blood counts.

## 2019-09-18 NOTE — Progress Notes (Signed)
Hematology and Oncology Follow Up Visit  Scott Zimmerman [id number] 1996-10-01 23 y.o. 09/18/2019 12:58 PM Scott Zimmerman, MDKoberlein, Steele Berg, MD   Principle Diagnosis: 23 year old man with right testicular cancer diagnosed in September 2019.  He was found to have stage Ib tumor with embryonal component of 60%.   Prior Therapy:  He is status post orchiectomy completed on 07/31/2019 under the care of Dr. Jeffie Zimmerman.  The pathology showed 2.0 cm with embryonal component at 60%, 30% seminoma and 5% yolk sac.  Current therapy: BEP chemotherapy started on 09/03/2019.  He is here for day 16 to conclude cycle 1.  Interim History: Scott Zimmerman returns today for a follow-up.  Since the last visit, he is completing adjuvant chemotherapy without any major complications.  He denies any residual nausea or vomiting but did report some fatigue and tiredness that has recovered at this time.  His performance status and quality of life remain excellent.  He denies any neuropathy or pulmonary complaints.  Patient denied any alteration mental status, neuropathy, confusion or dizziness.  Denies any headaches or lethargy.  Denies any night sweats, weight loss or changes in appetite.  Denied orthopnea, dyspnea on exertion or chest discomfort.  Denies shortness of breath, difficulty breathing hemoptysis or cough.  Denies any abdominal distention, nausea, early satiety or dyspepsia.  Denies any hematuria, frequency, dysuria or nocturia.  Denies any skin irritation, dryness or rash.  Denies any ecchymosis or petechiae.  Denies any lymphadenopathy or clotting.  Denies any heat or cold intolerance.  Denies any anxiety or depression.  Remaining review of system is negative.       Medications: I have reviewed the patient's current medications.  Current Outpatient Medications  Medication Sig Dispense Refill  . LORazepam (ATIVAN) 0.5 MG tablet 1 tablet 1 hour before treatment. Repeat if needed. 2 tablet 0  .  prochlorperazine (COMPAZINE) 10 MG tablet Take 1 tablet (10 mg total) by mouth every 6 (six) hours as needed for nausea or vomiting. 30 tablet 0  . triamcinolone cream (KENALOG) 0.5 % Apply 1 application topically as needed.     No current facility-administered medications for this visit.      Allergies: No Known Allergies  Past Medical History, Surgical history, Social history, and Family History were reviewed and updated.    Physical Exam: Blood pressure 116/77, pulse 78, temperature 98.5 F (36.9 C), temperature source Temporal, resp. rate 18, height 5\' 3"  (1.6 m), weight 134 lb 3.2 oz (60.9 kg), SpO2 100 %.   ECOG: 0    General appearance: Comfortable appearing without any discomfort Head: Normocephalic without any trauma Oropharynx: Mucous membranes are moist and pink without any thrush or ulcers. Eyes: Pupils are equal and round reactive to light. Lymph nodes: No cervical, supraclavicular, inguinal or axillary lymphadenopathy.   Heart:regular rate and rhythm.  S1 and S2 without leg edema. Lung: Clear without any rhonchi or wheezes.  No dullness to percussion. Abdomin: Soft, nontender, nondistended with good bowel sounds.  No hepatosplenomegaly. Musculoskeletal: No joint deformity or effusion.  Full range of motion noted. Neurological: No deficits noted on motor, sensory and deep tendon reflex exam. Skin: No petechial rash or dryness.  Appeared moist.  Psychiatric: Mood and affect appeared appropriate.      Lab Results: Lab Results  Component Value Date   WBC 6.5 09/03/2019   HGB 15.4 09/03/2019   HCT 44.4 09/03/2019   MCV 92.1 09/03/2019   PLT 231 09/03/2019     Chemistry  Component Value Date/Time   NA 142 09/03/2019 0730   K 4.0 09/03/2019 0730   CL 105 09/03/2019 0730   CO2 25 09/03/2019 0730   BUN 15 09/03/2019 0730   CREATININE 0.98 09/03/2019 0730      Component Value Date/Time   CALCIUM 9.4 09/03/2019 0730   ALKPHOS 84 09/03/2019 0730    AST 17 09/03/2019 0730   ALT 15 09/03/2019 0730   BILITOT 0.4 09/03/2019 0730       Impression and Plan: .  23 year old with:  1.  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    He was found to have stage Ib with large embryonal component.  He is completing adjuvant chemotherapy and has tolerated BEP without any issues.  Risks and benefits of receiving bleomycin chemotherapy to complete cycle 1 was discussed.  Potential complication associated with this medication including fevers and pulmonary toxicity were reviewed.  At this time he is agreeable to continue.   Further management options moving forward was discussed.  I do not believe that he requires a second cycle at this time.  I recommended active surveillance after that.  He is scheduled to follow-up with Dr. Jeffie Zimmerman to have repeat imaging studies in the next few months.  2.    Pulmonary toxicity surveillance: His PFT and DLCO was normal at baseline.  No issues with continuing bleomycin at this time.  3.  Survivorship issues: The toxicity associated with this chemotherapy long-term were reiterated.  Infertility as well as cardiovascular complications were discussed.  Given his young age and limited chemotherapy exposure I do not anticipate major long term complications.  4.  Follow-up: He will return in 4 months for repeat evaluation.   25  minutes was spent with the patient face-to-face today.  More than 50% of time was dedicated to reviewing his disease status, treatment options and long-term complication lytic therapy.     Scott Button, MD 11/17/202012:58 PM

## 2019-12-18 ENCOUNTER — Inpatient Hospital Stay (HOSPITAL_BASED_OUTPATIENT_CLINIC_OR_DEPARTMENT_OTHER): Payer: Commercial Managed Care - PPO | Admitting: Oncology

## 2019-12-18 ENCOUNTER — Other Ambulatory Visit: Payer: Self-pay

## 2019-12-18 ENCOUNTER — Telehealth: Payer: Self-pay | Admitting: Oncology

## 2019-12-18 ENCOUNTER — Inpatient Hospital Stay: Payer: Commercial Managed Care - PPO | Attending: Oncology

## 2019-12-18 VITALS — BP 131/58 | HR 70 | Temp 98.5°F | Resp 18 | Wt 137.4 lb

## 2019-12-18 DIAGNOSIS — C6291 Malignant neoplasm of right testis, unspecified whether descended or undescended: Secondary | ICD-10-CM

## 2019-12-18 DIAGNOSIS — Z8547 Personal history of malignant neoplasm of testis: Secondary | ICD-10-CM | POA: Diagnosis present

## 2019-12-18 LAB — CMP (CANCER CENTER ONLY)
ALT: 20 U/L (ref 0–44)
AST: 22 U/L (ref 15–41)
Albumin: 4.5 g/dL (ref 3.5–5.0)
Alkaline Phosphatase: 62 U/L (ref 38–126)
Anion gap: 11 (ref 5–15)
BUN: 16 mg/dL (ref 6–20)
CO2: 24 mmol/L (ref 22–32)
Calcium: 9.2 mg/dL (ref 8.9–10.3)
Chloride: 105 mmol/L (ref 98–111)
Creatinine: 0.98 mg/dL (ref 0.61–1.24)
GFR, Est AFR Am: >60 mL/min (ref >60)
GFR, Estimated: >60 mL/min (ref >60)
Glucose, Bld: 131 mg/dL — ABNORMAL HIGH (ref 70–99)
Potassium: 4.3 mmol/L (ref 3.5–5.1)
Sodium: 140 mmol/L (ref 135–145)
Total Bilirubin: 0.5 mg/dL (ref 0.3–1.2)
Total Protein: 7.1 g/dL (ref 6.5–8.1)

## 2019-12-18 LAB — CBC WITH DIFFERENTIAL (CANCER CENTER ONLY)
Abs Immature Granulocytes: 0.01 10*3/uL (ref 0.00–0.07)
Basophils Absolute: 0 10*3/uL (ref 0.0–0.1)
Basophils Relative: 1 %
Eosinophils Absolute: 0.1 10*3/uL (ref 0.0–0.5)
Eosinophils Relative: 3 %
HCT: 41.6 % (ref 39.0–52.0)
Hemoglobin: 14.4 g/dL (ref 13.0–17.0)
Immature Granulocytes: 0 %
Lymphocytes Relative: 42 %
Lymphs Abs: 2.2 10*3/uL (ref 0.7–4.0)
MCH: 32.1 pg (ref 26.0–34.0)
MCHC: 34.6 g/dL (ref 30.0–36.0)
MCV: 92.9 fL (ref 80.0–100.0)
Monocytes Absolute: 0.6 10*3/uL (ref 0.1–1.0)
Monocytes Relative: 12 %
Neutro Abs: 2.2 10*3/uL (ref 1.7–7.7)
Neutrophils Relative %: 42 %
Platelet Count: 210 10*3/uL (ref 150–400)
RBC: 4.48 MIL/uL (ref 4.22–5.81)
RDW: 11.7 % (ref 11.5–15.5)
WBC Count: 5.1 10*3/uL (ref 4.0–10.5)
nRBC: 0 % (ref 0.0–0.2)

## 2019-12-18 LAB — LACTATE DEHYDROGENASE: LDH: 151 U/L (ref 98–192)

## 2019-12-18 NOTE — Telephone Encounter (Signed)
Scheduled appt per 2/16 los.  Left a voice message of the appt date and time.

## 2019-12-18 NOTE — Progress Notes (Signed)
Hematology and Oncology Follow Up Visit  Scott Zimmerman [id number] 1996-02-02 23 y.o. 12/18/2019 8:14 AM Scott Zimmerman, MDKoberlein, Scott Berg, MD   Principle Diagnosis: 24 year old man with stage Ib nonseminomatous right testicular cancer with a 60% embryonal component diagnosed in September 2019.    Prior Therapy:  He is status post orchiectomy completed on 07/31/2019 under the care of Dr. Jeffie Zimmerman.  The pathology showed 2.0 cm with embryonal component at 60%, 30% seminoma and 5% yolk sac.  BEP chemotherapy started on 09/03/2019.  He received 1 cycle adjuvantly concluded on September 18, 2019.  Current therapy: Active surveillance.  Interim History: Scott Zimmerman is here for a return evaluation.  Since the last visit, he reports no major changes in his health.  He has fully recovered from the chemotherapy without any residual complications.  He denies nausea, fatigue, neuropathy for respiratory complaints.  He has resumed all his work-related duties without any issues.  He remains active and continues to eat well.       Medications: Reviewed and updated..  Current Outpatient Medications  Medication Sig Dispense Refill  . LORazepam (ATIVAN) 0.5 MG tablet 1 tablet 1 hour before treatment. Repeat if needed. 2 tablet 0  . prochlorperazine (COMPAZINE) 10 MG tablet Take 1 tablet (10 mg total) by mouth every 6 (six) hours as needed for nausea or vomiting. 30 tablet 0  . triamcinolone cream (KENALOG) 0.5 % Apply 1 application topically as needed.     No current facility-administered medications for this visit.     Allergies: No Known Allergies     Physical Exam:  Blood pressure (!) 131/58, pulse 70, temperature 98.5 F (36.9 C), temperature source Temporal, resp. rate 18, weight 137 lb 6.4 oz (62.3 kg), SpO2 100 %.   ECOG: 0    General appearance: Alert, awake without any distress. Head: Atraumatic without abnormalities Oropharynx: Without any thrush or ulcers. Eyes: No  scleral icterus. Lymph nodes: No lymphadenopathy noted in the cervical, supraclavicular, or axillary nodes Heart:regular rate and rhythm, without any murmurs or gallops.   Lung: Clear to auscultation without any rhonchi, wheezes or dullness to percussion. Abdomin: Soft, nontender without any shifting dullness or ascites. Musculoskeletal: No clubbing or cyanosis. Neurological: No motor or sensory deficits. Skin: No rashes or lesions.       Lab Results: Lab Results  Component Value Date   WBC 1.8 (L) 09/18/2019   HGB 11.5 (L) 09/18/2019   HCT 34.3 (L) 09/18/2019   MCV 94.5 09/18/2019   PLT 129 (L) 09/18/2019     Chemistry      Component Value Date/Time   NA 143 09/18/2019 1247   K 4.7 09/18/2019 1247   CL 105 09/18/2019 1247   CO2 26 09/18/2019 1247   BUN 12 09/18/2019 1247   CREATININE 0.97 09/18/2019 1247      Component Value Date/Time   CALCIUM 8.9 09/18/2019 1247   ALKPHOS 63 09/18/2019 1247   AST 15 09/18/2019 1247   ALT 20 09/18/2019 1247   BILITOT 0.3 09/18/2019 1247       Impression and Plan:  24 year old with:  1.  Stage Ib embryonal testicular cancer.  He was found to have nonseminomatous right tumor diagnosed in September 2019.      The natural course of this disease was reviewed at this time.  He status post orchiectomy followed by 1 cycle of BEP.  Risk of relapse was assessed and the role of different salvage therapy options were reviewed at this time.  I have recommended continued active surveillance and periodic imaging studies monitoring.  Tumor markers are currently pending from today and I recommended to do that every 6 months with physical examination.  We will also arrange for a CT scan to be done in 2 months and in 6 months after that.  2.    Pulmonary toxicity surveillance: He did not report any residual pulmonary issues related to bleomycin.  3.  Survivorship issues: We will continue to address that moving forward including residual  cardiovascular toxicities as well as fertility issues.  4.  Follow-up: We will be in 6 months for repeat evaluation.   30  minutes was dedicated to this visit today.  The time was spent on reviewing his disease status, treatment options and addressing late complications related to therapy.Scott Button, MD 2/16/20218:14 AM

## 2019-12-19 LAB — AFP TUMOR MARKER: AFP, Serum, Tumor Marker: 1.5 ng/mL (ref 0.0–8.3)

## 2019-12-19 LAB — BETA HCG QUANT (REF LAB): hCG Quant: 2 m[IU]/mL (ref 0–3)

## 2020-01-03 ENCOUNTER — Ambulatory Visit (HOSPITAL_COMMUNITY): Payer: Commercial Managed Care - PPO

## 2020-02-08 ENCOUNTER — Encounter (HOSPITAL_COMMUNITY): Payer: Self-pay

## 2020-02-08 ENCOUNTER — Other Ambulatory Visit: Payer: Self-pay

## 2020-02-08 ENCOUNTER — Ambulatory Visit (HOSPITAL_COMMUNITY)
Admission: RE | Admit: 2020-02-08 | Discharge: 2020-02-08 | Disposition: A | Discharge status: Home / Self Care | Payer: Commercial Managed Care - PPO | Source: Ambulatory Visit | Attending: Oncology | Admitting: Oncology

## 2020-02-08 DIAGNOSIS — C6291 Malignant neoplasm of right testis, unspecified whether descended or undescended: Secondary | ICD-10-CM | POA: Insufficient documentation

## 2020-02-08 MED ORDER — IOHEXOL 300 MG/ML  SOLN
100.0000 mL | Freq: Once | INTRAMUSCULAR | Status: AC | PRN
  Administered 2020-02-08: 08:00:00 100 mL via INTRAVENOUS

## 2020-02-08 MED ORDER — SODIUM CHLORIDE (PF) 0.9 % IJ SOLN
INTRAMUSCULAR | Status: AC
  Filled 2020-02-08: qty 50

## 2020-05-15 ENCOUNTER — Ambulatory Visit (INDEPENDENT_AMBULATORY_CARE_PROVIDER_SITE_OTHER): Payer: Commercial Managed Care - PPO | Admitting: Family Medicine

## 2020-05-15 ENCOUNTER — Encounter: Payer: Self-pay | Admitting: Family Medicine

## 2020-05-15 ENCOUNTER — Other Ambulatory Visit: Payer: Self-pay

## 2020-05-15 VITALS — BP 90/60 | HR 89 | Temp 99.2°F | Ht 63.0 in | Wt 126.1 lb

## 2020-05-15 DIAGNOSIS — R739 Hyperglycemia, unspecified: Secondary | ICD-10-CM | POA: Diagnosis not present

## 2020-05-15 DIAGNOSIS — C6291 Malignant neoplasm of right testis, unspecified whether descended or undescended: Secondary | ICD-10-CM

## 2020-05-15 LAB — POCT GLYCOSYLATED HEMOGLOBIN (HGB A1C): Hemoglobin A1C: 4.8 % (ref 4.0–5.6)

## 2020-05-15 NOTE — Progress Notes (Signed)
Scott Zimmerman DOB: Mar 21, 1996 Encounter date: 05/15/2020  This is a 24 y.o. male who presents with Chief Complaint  Patient presents with   Hip Pain    patient complains of left hip "sensation or soreness" x3 weeks ago, states he is concenned due to initially being diagnosed with testicular cancer, has appointment with oncology next month    History of present illness: It's not that it has been there for a few weeks.  States that what is worrying him is difficult to describe and not something that he can only put his finger on.  Makes him think about it all the time; worries that unusual things could be related to cancer. Has been so focused on it now that he can't tell if it is something to be concerned with/hard to get out of head. Really any time he has something he gets worried. Does have sore hip flexors from sports in past and now transporting lumbar/tile up stairs, so thinks that some of soreness is related to some of physical job. Has appointment with oncology in August and knows that he is getting bloodwork then, but just felt like this was too far away for him to live with worry.  He has not discussed his concerns of any family members as he does not want to burden them with something that is "not an issue"  Has been doing self exam and hasn't noticed anything as far as that goes. Has small nodule on left side and states that this was noted on previous CT.   Not necessarily able to reproduce tenderness with standing in certain position or stretching in certain way.   Whole process has stressed him out and made him worry. Bowel and bladder working ok. No fevers, chills, sweats. No sick symptoms.   No Known Allergies Current Meds  Medication Sig   triamcinolone cream (KENALOG) 0.5 % Apply 1 application topically as needed.    Review of Systems  Constitutional: Negative for appetite change, chills, fatigue, fever and unexpected weight change. Activity change: has been more  active with work.  HENT: Negative for congestion, postnasal drip, sinus pressure and sinus pain.   Respiratory: Negative for cough, chest tightness, shortness of breath and wheezing.   Cardiovascular: Negative for chest pain, palpitations and leg swelling.  Gastrointestinal: Negative for abdominal pain, blood in stool, constipation, diarrhea, nausea and vomiting.  Genitourinary: Negative for difficulty urinating, dysuria, flank pain, frequency, hematuria, penile pain, scrotal swelling and testicular pain.  Skin: Negative for color change and rash.  Neurological: Negative for dizziness, light-headedness and headaches.  Psychiatric/Behavioral: Positive for decreased concentration and sleep disturbance. Negative for suicidal ideas. The patient is nervous/anxious.    Happy with current weight; states he was overdoing it with eating before, but now working on healthy eating.   Objective:  BP 90/60 (BP Location: Left Arm, Patient Position: Sitting, Cuff Size: Normal)    Pulse 89    Temp 99.2 F (37.3 C) (Oral)    Ht 5\' 3"  (1.6 m)    Wt 126 lb 1.6 oz (57.2 kg)    BMI 22.34 kg/m   Weight: 126 lb 1.6 oz (57.2 kg) (states was outside throwing ball prior to coming here).   BP Readings from Last 3 Encounters:  05/15/20 90/60  12/18/19 (!) 131/58  09/18/19 116/77   Wt Readings from Last 3 Encounters:  05/15/20 126 lb 1.6 oz (57.2 kg)  12/18/19 137 lb 6.4 oz (62.3 kg)  09/18/19 134 lb 3.2 oz (60.9  kg)    Physical Exam Constitutional:      General: He is not in acute distress.    Appearance: He is well-developed.  Cardiovascular:     Rate and Rhythm: Normal rate and regular rhythm.     Heart sounds: Normal heart sounds. No murmur heard.  No friction rub.  Pulmonary:     Effort: Pulmonary effort is normal. No respiratory distress.     Breath sounds: Normal breath sounds. No wheezing or rales.  Abdominal:     General: Abdomen is flat. Bowel sounds are normal.     Palpations: Abdomen is soft.      Tenderness: There is no abdominal tenderness. There is no right CVA tenderness, left CVA tenderness, guarding or rebound. Negative signs include Murphy's sign, Rovsing's sign and McBurney's sign.     Hernia: No hernia is present.  Genitourinary:    Penis: Normal and circumcised.      Testes:        Left: Mass, tenderness or swelling not present. Left testis is descended.     Epididymis:     Left: Normal.     Comments: Testicular implant right Musculoskeletal:     Right lower leg: No edema.     Left lower leg: No edema.     Comments: No significant discomfort with flexion or extension of the hips, no significant discomfort with internal or external rotation.  There is mild tenderness to hip flexors with palpation, as a stretching sensation of anterior and/or lateral hip with runner's stretch position.  Otherwise full range of motion.  Lymphadenopathy:     Head:     Right side of head: No submental or submandibular adenopathy.     Left side of head: No submental or submandibular adenopathy.     Cervical: No cervical adenopathy.     Upper Body:     Right upper body: No supraclavicular, axillary, pectoral or epitrochlear adenopathy.     Left upper body: No supraclavicular, axillary, pectoral or epitrochlear adenopathy.     Lower Body: No left inguinal adenopathy.  Neurological:     Mental Status: He is alert and oriented to person, place, and time.  Psychiatric:        Behavior: Behavior normal.     Assessment/Plan  1. Hyperglycemia A1c in office is normal today.  Patient reassured by this. - POC HgB A1c  2. Malignant neoplasm of right testis, unspecified whether descended or undescended [facility]) Patient has been through chemotherapy as well as surgical removal.  He is following with oncology and has an appointment next month.  Exam today is quite normal.  He is very anxious, so I have forwarded a message to oncology to check in and see if any additional work-up would be warranted  at this time.  Patient feels reassured by this.  He does not want to proceed with any unnecessary imaging, but would like some peace of mind.  Additionally, we discussed anxiety and worry level today, and he is not at a point where he feels we need to intervene from this standpoint.  He feels that as long as we are reassured by exam/lab work that he will feel reassured as well.  I have encouraged him to keep monitoring signs and symptoms that concern him with his body and to keep [place] posted.  I will be back in touch with him once I am able to touch base with oncology.  Return for pending discussion with oncology.   30 minutes spent  in chart review, time with patient, reassuring, discussion of potential additional evaluation, charting. Micheline Rough, MD

## 2020-06-17 ENCOUNTER — Encounter: Payer: Self-pay | Admitting: Oncology

## 2020-06-17 ENCOUNTER — Inpatient Hospital Stay: Payer: Commercial Managed Care - PPO | Attending: Oncology | Admitting: Oncology

## 2020-06-17 ENCOUNTER — Other Ambulatory Visit: Payer: Self-pay

## 2020-06-17 ENCOUNTER — Inpatient Hospital Stay: Payer: Commercial Managed Care - PPO

## 2020-06-17 VITALS — BP 110/61 | HR 62 | Temp 97.8°F | Resp 18 | Ht 63.0 in | Wt 129.3 lb

## 2020-06-17 DIAGNOSIS — C6291 Malignant neoplasm of right testis, unspecified whether descended or undescended: Secondary | ICD-10-CM | POA: Diagnosis not present

## 2020-06-17 DIAGNOSIS — Z9221 Personal history of antineoplastic chemotherapy: Secondary | ICD-10-CM | POA: Insufficient documentation

## 2020-06-17 DIAGNOSIS — Z8547 Personal history of malignant neoplasm of testis: Secondary | ICD-10-CM | POA: Insufficient documentation

## 2020-06-17 DIAGNOSIS — Z9079 Acquired absence of other genital organ(s): Secondary | ICD-10-CM | POA: Insufficient documentation

## 2020-06-17 LAB — LACTATE DEHYDROGENASE: LDH: 132 U/L (ref 98–192)

## 2020-06-17 LAB — CMP (CANCER CENTER ONLY)
ALT: 18 U/L (ref 0–44)
AST: 20 U/L (ref 15–41)
Albumin: 4.5 g/dL (ref 3.5–5.0)
Alkaline Phosphatase: 62 U/L (ref 38–126)
Anion gap: 10 (ref 5–15)
BUN: 15 mg/dL (ref 6–20)
CO2: 26 mmol/L (ref 22–32)
Calcium: 10.2 mg/dL (ref 8.9–10.3)
Chloride: 104 mmol/L (ref 98–111)
Creatinine: 1.01 mg/dL (ref 0.61–1.24)
GFR, Est AFR Am: >60 mL/min (ref >60)
GFR, Estimated: >60 mL/min (ref >60)
Glucose, Bld: 89 mg/dL (ref 70–99)
Potassium: 4.2 mmol/L (ref 3.5–5.1)
Sodium: 140 mmol/L (ref 135–145)
Total Bilirubin: 0.8 mg/dL (ref 0.3–1.2)
Total Protein: 7.2 g/dL (ref 6.5–8.1)

## 2020-06-17 LAB — CBC WITH DIFFERENTIAL (CANCER CENTER ONLY)
Abs Immature Granulocytes: 0.02 10*3/uL (ref 0.00–0.07)
Basophils Absolute: 0 10*3/uL (ref 0.0–0.1)
Basophils Relative: 1 %
Eosinophils Absolute: 0.1 10*3/uL (ref 0.0–0.5)
Eosinophils Relative: 3 %
HCT: 41.3 % (ref 39.0–52.0)
Hemoglobin: 14.2 g/dL (ref 13.0–17.0)
Immature Granulocytes: 0 %
Lymphocytes Relative: 45 %
Lymphs Abs: 2.4 10*3/uL (ref 0.7–4.0)
MCH: 31.6 pg (ref 26.0–34.0)
MCHC: 34.4 g/dL (ref 30.0–36.0)
MCV: 91.8 fL (ref 80.0–100.0)
Monocytes Absolute: 0.6 10*3/uL (ref 0.1–1.0)
Monocytes Relative: 11 %
Neutro Abs: 2.1 10*3/uL (ref 1.7–7.7)
Neutrophils Relative %: 40 %
Platelet Count: 190 10*3/uL (ref 150–400)
RBC: 4.5 MIL/uL (ref 4.22–5.81)
RDW: 11.9 % (ref 11.5–15.5)
WBC Count: 5.2 10*3/uL (ref 4.0–10.5)
nRBC: 0 % (ref 0.0–0.2)

## 2020-06-17 NOTE — Progress Notes (Signed)
Hematology and Oncology Follow Up Visit  Scott Zimmerman [id number] [birth year] 24 y.o. 06/17/2020 8:12 AM Scott Zimmerman, MDKoberlein, Steele Berg, MD   Principle Diagnosis: 24 year old man with testicular cancer diagnosed in September 2019.  He was found to have stage Ib nonseminomatous tumor with 60% embryonal component.    Prior Therapy:  He is status post orchiectomy completed on 07/31/2019 under the care of Dr. Jeffie Zimmerman.  The pathology showed 2.0 cm with embryonal component at 60%, 30% seminoma and 5% yolk sac.  BEP chemotherapy started on 09/03/2019.  He received 1 cycle adjuvantly concluded on September 18, 2019.  Current therapy: Active surveillance.  Interim History: Scott Zimmerman returns today for a follow-up visit.  Since the last visit, he reports no major changes in his health.  He continues to feel well without any residual complications related to chemotherapy or surgery.  He has resumed all activities of daily living.  He denies any nausea vomiting or shortness of breath.  He denies any abdominal discomfort or early satiety.      Medications: Unchanged on review. Current Outpatient Medications  Medication Sig Dispense Refill  . triamcinolone cream (KENALOG) 0.5 % Apply 1 application topically as needed.     No current facility-administered medications for this visit.     Allergies: No Known Allergies     Physical Exam:  Blood pressure 110/61, pulse 62, temperature 97.8 F (36.6 C), temperature source Temporal, resp. rate 18, height 5\' 3"  (1.6 m), weight 129 lb 4.8 oz (58.7 kg), SpO2 100 %.    ECOG: 0      General appearance: Comfortable appearing without any discomfort Head: Normocephalic without any trauma Oropharynx: Mucous membranes are moist and pink without any thrush or ulcers. Eyes: Pupils are equal and round reactive to light. Lymph nodes: No cervical, supraclavicular, inguinal or axillary lymphadenopathy.   Heart:regular rate and rhythm.  S1 and  S2 without leg edema. Lung: Clear without any rhonchi or wheezes.  No dullness to percussion. Abdomin: Soft, nontender, nondistended with good bowel sounds.  No hepatosplenomegaly. Musculoskeletal: No joint deformity or effusion.  Full range of motion noted. Neurological: No deficits noted on motor, sensory and deep tendon reflex exam. Skin: No petechial rash or dryness.  Appeared moist.         Lab Results: Lab Results  Component Value Date   WBC 5.1 12/18/2019   HGB 14.4 12/18/2019   HCT 41.6 12/18/2019   MCV 92.9 12/18/2019   PLT 210 12/18/2019     Chemistry      Component Value Date/Time   NA 140 12/18/2019 0806   K 4.3 12/18/2019 0806   CL 105 12/18/2019 0806   CO2 24 12/18/2019 0806   BUN 16 12/18/2019 0806   CREATININE 0.98 12/18/2019 0806      Component Value Date/Time   CALCIUM 9.2 12/18/2019 0806   ALKPHOS 62 12/18/2019 0806   AST 22 12/18/2019 0806   ALT 20 12/18/2019 0806   BILITOT 0.5 12/18/2019 0806       Impression and Plan:  24 year old with:  1.  Right testicular cancer diagnosed in September 2019.  He was found to have stage Ib nonseminoma with embryonal component at 60%.     He is status post orchiectomy followed by adjuvant chemotherapy and currently on active surveillance.  The natural course of his disease and risk of relapse was assessed at this time.  He continues to show no evidence of recurrent disease with tumor markers and imaging studies  confirming that.  His CT scan in April 2021 showed no evidence of disease.  I have recommended continued monitoring at this time with tumor markers every 3 to 6 months for the first 2 years with imaging studies annually for the first 2 years.  Tumor markers and physical examination become every 6 months and subsequent years with imaging studies to be done as needed.  He is agreeable to proceed at this time.  2.    Pulmonary toxicity surveillance: No issues reported after vancomycin exposure.  3.   Survivorship issues: I continue to emphasize the importance of minimizing cardiovascular risks and age-appropriate cancer screening and health maintenance issues after chemotherapy exposure.  4.  Follow-up: In 6 months for repeat evaluation and repeat imaging studies.   30  minutes were spent on this encounter.  The time was dedicated to updating his disease status, reviewing imaging studies and outlining future plan of care.     Scott Button, MD 8/17/20218:12 AM

## 2020-06-18 LAB — AFP TUMOR MARKER: AFP, Serum, Tumor Marker: 1.7 ng/mL (ref 0.0–8.3)

## 2020-06-18 LAB — BETA HCG QUANT (REF LAB): hCG Quant: <1 m[IU]/mL (ref 0–3)

## 2020-12-03 ENCOUNTER — Other Ambulatory Visit: Payer: Self-pay

## 2020-12-03 ENCOUNTER — Ambulatory Visit (INDEPENDENT_AMBULATORY_CARE_PROVIDER_SITE_OTHER): Payer: Commercial Managed Care - PPO | Admitting: Dermatology

## 2020-12-03 ENCOUNTER — Encounter: Payer: Self-pay | Admitting: Dermatology

## 2020-12-03 DIAGNOSIS — Z1283 Encounter for screening for malignant neoplasm of skin: Secondary | ICD-10-CM

## 2020-12-03 DIAGNOSIS — L2089 Other atopic dermatitis: Secondary | ICD-10-CM

## 2020-12-03 MED ORDER — BETAMETHASONE DIPROPIONATE AUG 0.05 % EX CREA: TOPICAL_CREAM | Freq: Two times a day (BID) | CUTANEOUS | 0 refills | Status: AC

## 2020-12-12 ENCOUNTER — Encounter: Payer: Self-pay | Admitting: Dermatology

## 2020-12-12 NOTE — Progress Notes (Signed)
   New Patient Visit   Subjective  Scott Zimmerman is a 25 y.o. male who presents for the following: Eczema (Right elbow & right inner thigh flare).  Eczema Location: Arm and leg Duration:  Quality:  Associated Signs/Symptoms: Modifying Factors: Triamcinolone Severity:  Timing: Context: Also would like moles checked  Objective  Well appearing patient in no apparent distress; mood and affect are within normal limits. Objective  Chest - Medial [facility]): General skin examination, no atypical moles or nonmobile skin cancer.  Objective  Right Forearm - Posterior, Right Thigh - Anterior: Patchy chronic dermatitis compatible with atopic eczema.   Previously tried triamcinolone 0.05% Clobetasol cream 0.05% Current treatment aug betamethasone dip cream    A full examination was performed including scalp, head, eyes, ears, nose, lips, neck, chest, axillae, abdomen, back, buttocks, bilateral upper extremities, bilateral lower extremities, hands, feet, fingers, toes, fingernails, and toenails. All findings within normal limits unless otherwise noted below. I, Lavonna Monarch, MD, have reviewed all documentation for this visit. The documentation on 12/12/20 for the exam, diagnosis, procedures, and orders are all accurate and complete.   Assessment & Plan    Screening exam for skin cancer Chest - Medial [facility])  Encouraged to self examine his skin twice annually.  Recheck with dermatologist every 1 to 3 years or on a as needed basis.  Other atopic dermatitis (2) Right Forearm - Posterior; Right Thigh - Anterior  Discussed chronic nature of eczema with potential benefits from  maintaining skin hydration.  Generic augmented betamethasone debris and a daily after bathing; avoid use on face and body folds.  Treat for 30 days and then contact the office by MyChart or telephone with a status report.  Ordered Medications: augmented betamethasone dipropionate (DIPROLENE-AF) 0.05 %  cream     I, Lavonna Monarch, MD, have reviewed all documentation for this visit.  The documentation on 12/12/20 for the exam, diagnosis, procedures, and orders are all accurate and complete.

## 2020-12-23 ENCOUNTER — Telehealth: Payer: Self-pay | Admitting: Oncology

## 2020-12-23 NOTE — Telephone Encounter (Signed)
Called patient regarding upcoming March appointments, patient is notified. Notified patient to contact [facility] regarding CT scan.

## 2021-01-08 ENCOUNTER — Other Ambulatory Visit: Payer: Self-pay

## 2021-01-08 ENCOUNTER — Ambulatory Visit (HOSPITAL_COMMUNITY)
Admission: RE | Admit: 2021-01-08 | Discharge: 2021-01-08 | Disposition: A | Discharge status: Home / Self Care | Payer: Commercial Managed Care - PPO | Source: Ambulatory Visit | Attending: Oncology | Admitting: Oncology

## 2021-01-08 ENCOUNTER — Inpatient Hospital Stay: Payer: Commercial Managed Care - PPO | Attending: Oncology

## 2021-01-08 DIAGNOSIS — Z9221 Personal history of antineoplastic chemotherapy: Secondary | ICD-10-CM | POA: Diagnosis not present

## 2021-01-08 DIAGNOSIS — Z8547 Personal history of malignant neoplasm of testis: Secondary | ICD-10-CM | POA: Insufficient documentation

## 2021-01-08 DIAGNOSIS — C6291 Malignant neoplasm of right testis, unspecified whether descended or undescended: Secondary | ICD-10-CM

## 2021-01-08 DIAGNOSIS — Z9079 Acquired absence of other genital organ(s): Secondary | ICD-10-CM | POA: Insufficient documentation

## 2021-01-08 LAB — CMP (CANCER CENTER ONLY)
ALT: 28 U/L (ref 0–44)
AST: 33 U/L (ref 15–41)
Albumin: 5 g/dL (ref 3.5–5.0)
Alkaline Phosphatase: 64 U/L (ref 38–126)
Anion gap: 7 (ref 5–15)
BUN: 10 mg/dL (ref 6–20)
CO2: 27 mmol/L (ref 22–32)
Calcium: 9.9 mg/dL (ref 8.9–10.3)
Chloride: 105 mmol/L (ref 98–111)
Creatinine: 0.99 mg/dL (ref 0.61–1.24)
GFR, Estimated: >60 mL/min (ref >60)
Glucose, Bld: 96 mg/dL (ref 70–99)
Potassium: 4.4 mmol/L (ref 3.5–5.1)
Sodium: 139 mmol/L (ref 135–145)
Total Bilirubin: 0.8 mg/dL (ref 0.3–1.2)
Total Protein: 7.9 g/dL (ref 6.5–8.1)

## 2021-01-08 LAB — CBC WITH DIFFERENTIAL (CANCER CENTER ONLY)
Abs Immature Granulocytes: 0.02 10*3/uL (ref 0.00–0.07)
Basophils Absolute: 0 10*3/uL (ref 0.0–0.1)
Basophils Relative: 1 %
Eosinophils Absolute: 0.1 10*3/uL (ref 0.0–0.5)
Eosinophils Relative: 3 %
HCT: 42.9 % (ref 39.0–52.0)
Hemoglobin: 14.7 g/dL (ref 13.0–17.0)
Immature Granulocytes: 0 %
Lymphocytes Relative: 45 %
Lymphs Abs: 2.4 10*3/uL (ref 0.7–4.0)
MCH: 32 pg (ref 26.0–34.0)
MCHC: 34.3 g/dL (ref 30.0–36.0)
MCV: 93.5 fL (ref 80.0–100.0)
Monocytes Absolute: 0.5 10*3/uL (ref 0.1–1.0)
Monocytes Relative: 9 %
Neutro Abs: 2.2 10*3/uL (ref 1.7–7.7)
Neutrophils Relative %: 42 %
Platelet Count: 213 10*3/uL (ref 150–400)
RBC: 4.59 MIL/uL (ref 4.22–5.81)
RDW: 11.9 % (ref 11.5–15.5)
WBC Count: 5.3 10*3/uL (ref 4.0–10.5)
nRBC: 0 % (ref 0.0–0.2)

## 2021-01-08 LAB — LACTATE DEHYDROGENASE: LDH: 156 U/L (ref 98–192)

## 2021-01-08 MED ORDER — IOHEXOL 300 MG/ML  SOLN
100.0000 mL | Freq: Once | INTRAMUSCULAR | Status: AC | PRN
  Administered 2021-01-08: 100 mL via INTRAVENOUS

## 2021-01-09 LAB — AFP TUMOR MARKER: AFP, Serum, Tumor Marker: 2 ng/mL (ref 0.0–5.7)

## 2021-01-09 LAB — BETA HCG QUANT (REF LAB): hCG Quant: <1 m[IU]/mL (ref 0–3)

## 2021-01-15 ENCOUNTER — Other Ambulatory Visit: Payer: Self-pay

## 2021-01-15 ENCOUNTER — Inpatient Hospital Stay (HOSPITAL_BASED_OUTPATIENT_CLINIC_OR_DEPARTMENT_OTHER): Payer: Commercial Managed Care - PPO | Admitting: Oncology

## 2021-01-15 VITALS — BP 113/67 | HR 81 | Temp 98.0°F | Resp 13 | Ht 63.0 in | Wt 134.1 lb

## 2021-01-15 DIAGNOSIS — Z8547 Personal history of malignant neoplasm of testis: Secondary | ICD-10-CM | POA: Diagnosis not present

## 2021-01-15 DIAGNOSIS — C6291 Malignant neoplasm of right testis, unspecified whether descended or undescended: Secondary | ICD-10-CM

## 2021-01-15 NOTE — Progress Notes (Signed)
Hematology and Oncology Follow Up Visit  Scott Zimmerman [id number] September 30, 1996 25 y.o. 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     Prior Therapy:  He is status post orchiectomy completed on 07/31/2019 under the care of Dr. Jeffie Pollock.  The pathology showed 2.0 cm with embryonal component at 60%, 30% seminoma and 5% yolk sac.  BEP chemotherapy started on 09/03/2019.  He received 1 cycle adjuvantly concluded on September 18, 2019.  Current therapy: Active surveillance.  Interim History: Scott Zimmerman is here for return evaluation.  Since the last visit, he reports feeling well without any major complaints at this time.  Denies any residual complications related to his chemotherapy.  Denies any nausea vomiting abdominal pain.  Denies any worsening neuropathy.     Medications: Reviewed without changes. Current Outpatient Medications  Medication Sig Dispense Refill  . augmented betamethasone dipropionate (DIPROLENE-AF) 0.05 % cream Apply topically 2 (two) times daily. 30 g 0  . triamcinolone cream (KENALOG) 0.5 % Apply 1 application topically as needed.     No current facility-administered medications for this visit.     Allergies: No Known Allergies     Physical Exam:  Blood pressure 113/67, pulse 81, temperature 98 F (36.7 C), temperature source Tympanic, resp. rate 13, height 5\' 3"  (1.6 m), weight 134 lb 1.6 oz (60.8 kg), SpO2 100 %.     ECOG: 0     General appearance: Alert, awake without any distress. Head: Atraumatic without abnormalities Oropharynx: Without any thrush or ulcers. Eyes: No scleral icterus. Lymph nodes: No lymphadenopathy noted in the cervical, supraclavicular, or axillary nodes Heart:regular rate and rhythm, without any murmurs or gallops.   Lung: Clear to auscultation without any  rhonchi, wheezes or dullness to percussion. Abdomin: Soft, nontender without any shifting dullness or ascites. Musculoskeletal: No clubbing or cyanosis. Neurological: No motor or sensory deficits. Skin: No rashes or lesions.          Lab Results: Lab Results  Component Value Date   WBC 5.3 01/08/2021   HGB 14.7 01/08/2021   HCT 42.9 01/08/2021   MCV 93.5 01/08/2021   PLT 213 01/08/2021     Chemistry      Component Value Date/Time   NA 139 01/08/2021 0930   K 4.4 01/08/2021 0930   CL 105 01/08/2021 0930   CO2 27 01/08/2021 0930   BUN 10 01/08/2021 0930   CREATININE 0.99 01/08/2021 0930      Component Value Date/Time   CALCIUM 9.9 01/08/2021 0930   ALKPHOS 64 01/08/2021 0930   AST 33 01/08/2021 0930   ALT 28 01/08/2021 0930   BILITOT 0.8 01/08/2021 0930     Results for Scott Zimmerman, Scott RILEY "RILEY" (MRN [id number]) as of 01/15/2021 09:56  Ref. Range 01/08/2021 09:30  AFP, Serum, Tumor Marker Latest Ref Range: 0.0 - 5.7 ng/mL 2.0  hCG Quant Latest Ref Range: 0 - 3 mIU/mL <1  IMPRESSION: 1. No evidence of metastatic disease within the chest, abdomen, or pelvis. 2. Status post right orchidectomy with right testicular prosthesis in place. No mass or fluid collection the right inguinal canal.  Impression and Plan:  25 year old with:  1.  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     The natural course of his disease was updated at this time and treatment options were  reviewed.  He is currently on active surveillance without any evidence of disease relapse.  Laboratory testing on January 08, 2021 was reviewed and showed normal tumor markers hematological parameters.  CT scan obtained on January 08, 2021 showed no evidence of disease relapse at this time.  The natural course of this disease was reviewed and management options were reiterated.  I commended the continued active surveillance with physical examination and laboratory  testing every and imaging studies annually till year 4.  We will suspend imaging studies after that.  2.    Pulmonary toxicity surveillance: No residual toxicity related to bleomycin.  3.  Survivorship issues: These were reiterated today given his platinum exposure including cardiovascular risk medication and age-appropriate cancer screening.  4.  Follow-up: In 12 months for repeat evaluation and imaging studies.   30  minutes were dedicated to this visit.  The time was spent on reviewing disease status, reviewing imaging studies and outlining future plan.     Scott Button, MD 3/17/202210:17 AM

## 2021-02-23 ENCOUNTER — Encounter: Payer: Self-pay | Admitting: Family Medicine

## 2021-06-17 IMAGING — US US SCROTUM W/ DOPPLER COMPLETE
1 series · 13 of 25 positions shown · non-contrast
Comparison: None.

CLINICAL DATA: Pain right scrotum

EXAM:
SCROTAL ULTRASOUND
DOPPLER ULTRASOUND OF THE TESTICLES
TECHNIQUE: Complete ultrasound examination of the testicles, epididymis, and
other scrotal structures was performed. Color and spectral Doppler
ultrasound were also utilized to evaluate blood flow to the
testicles.

[Series 1: us scrotum w/ doppler complete · 0.05mm/px · 13 of 70 slices shown]
[im 1/70]
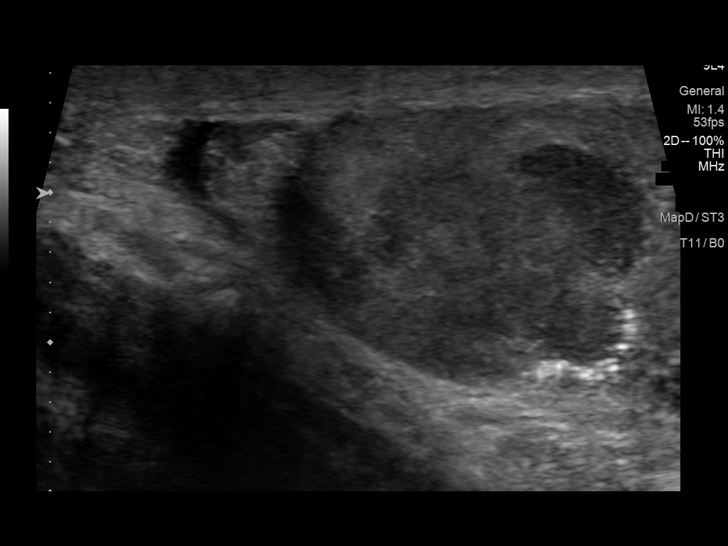
[im 6/70]
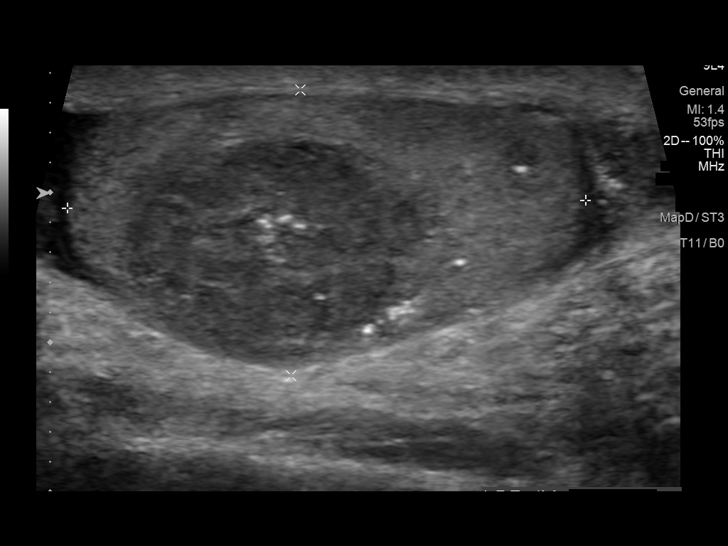
[im 12/70]
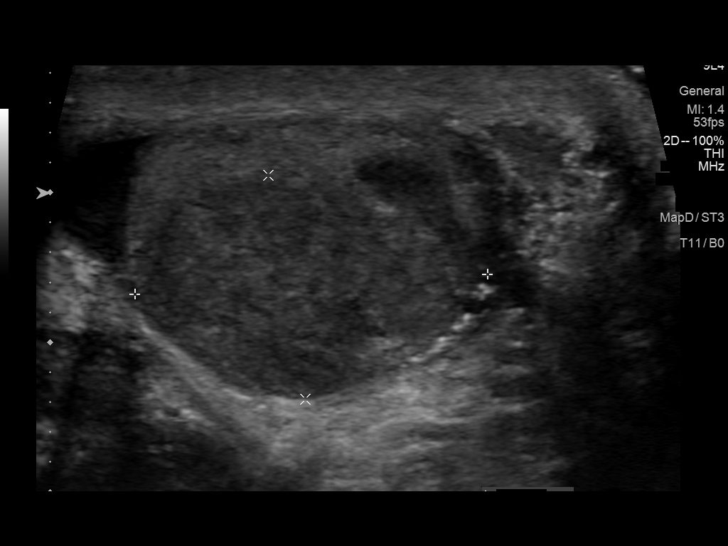
[im 18/70]
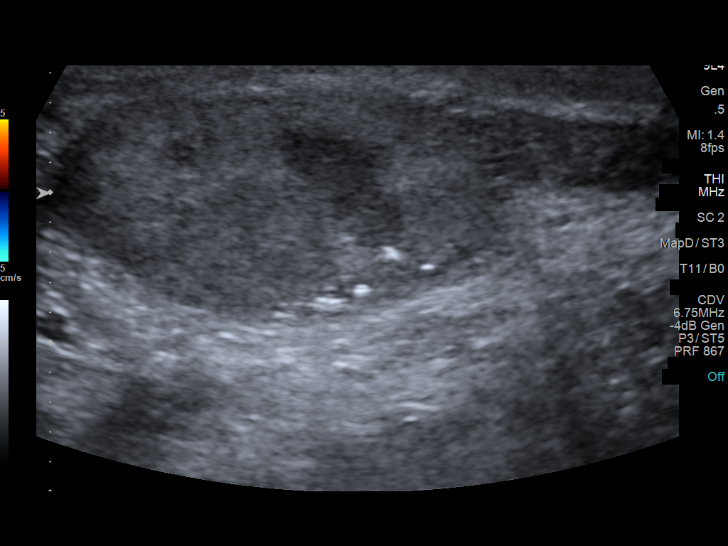
[im 24/70]
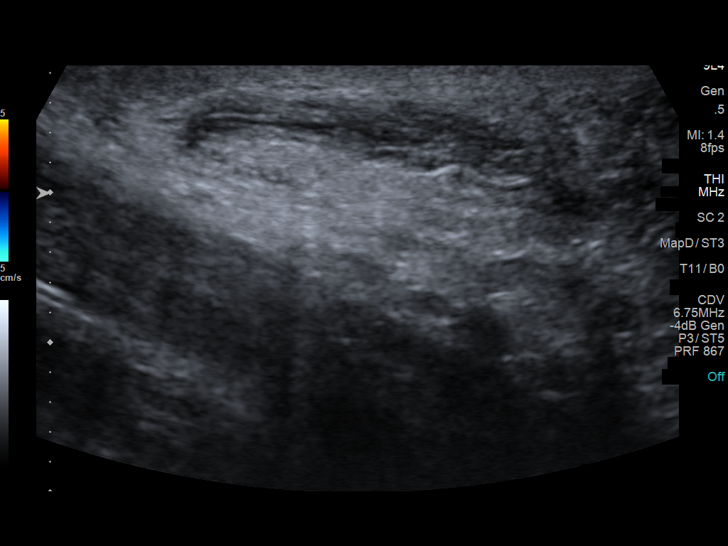
[im 29/70]
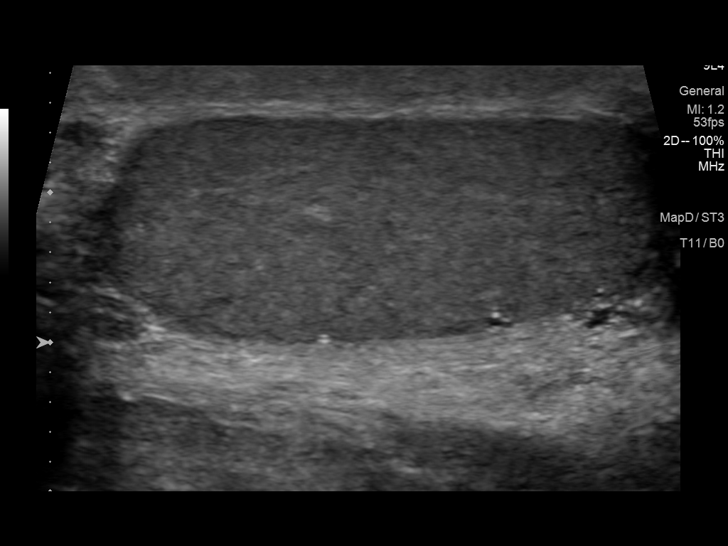
[im 35/70]
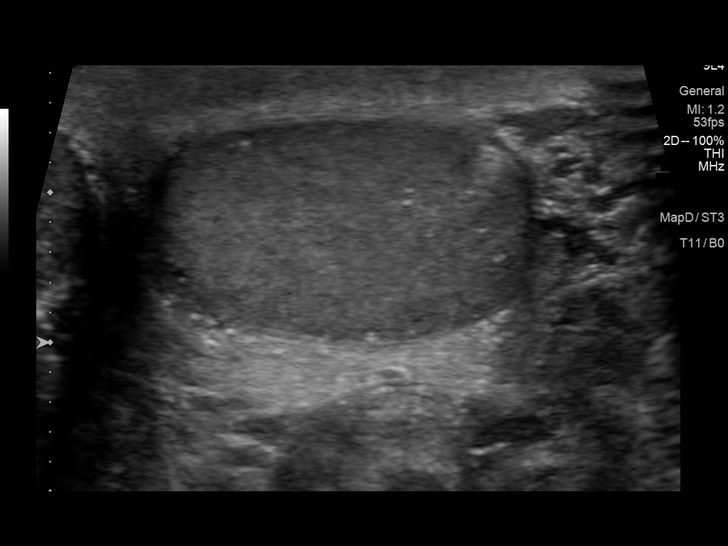
[im 41/70]
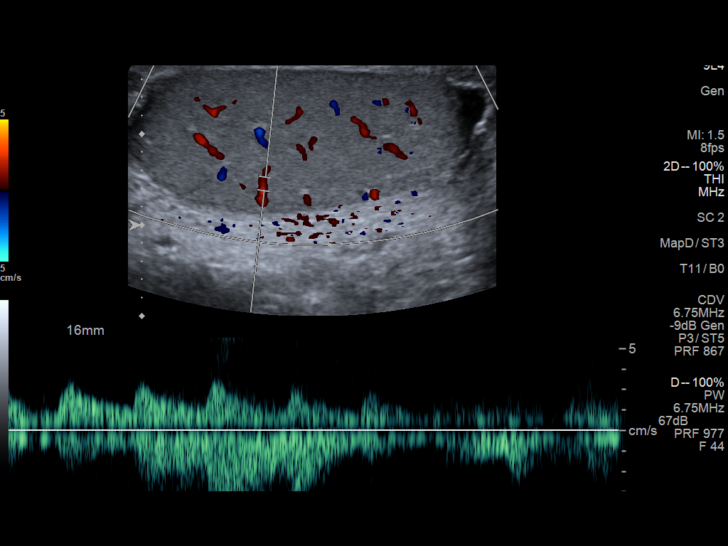
[im 47/70]
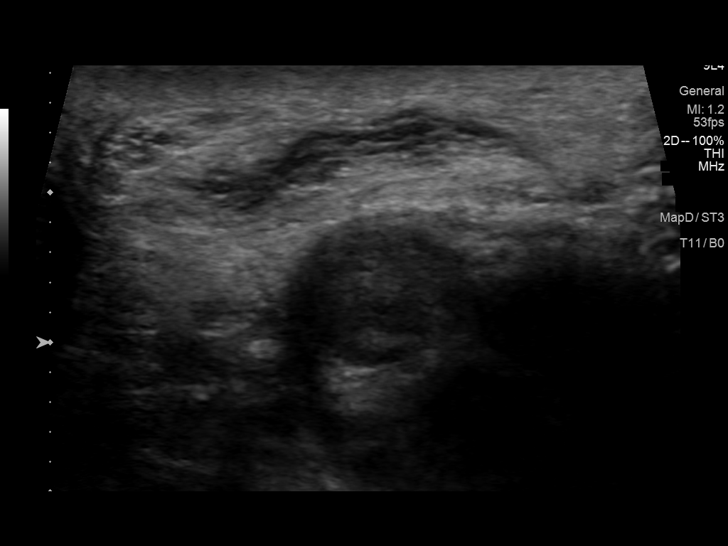
[im 52/70]
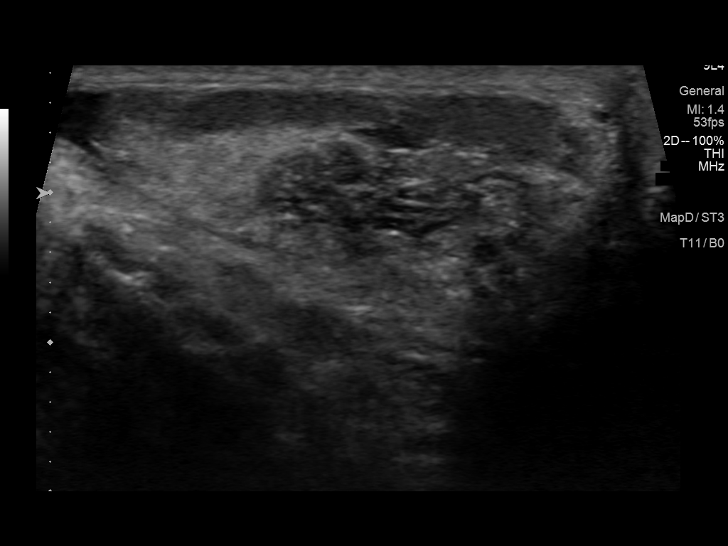
[im 58/70]
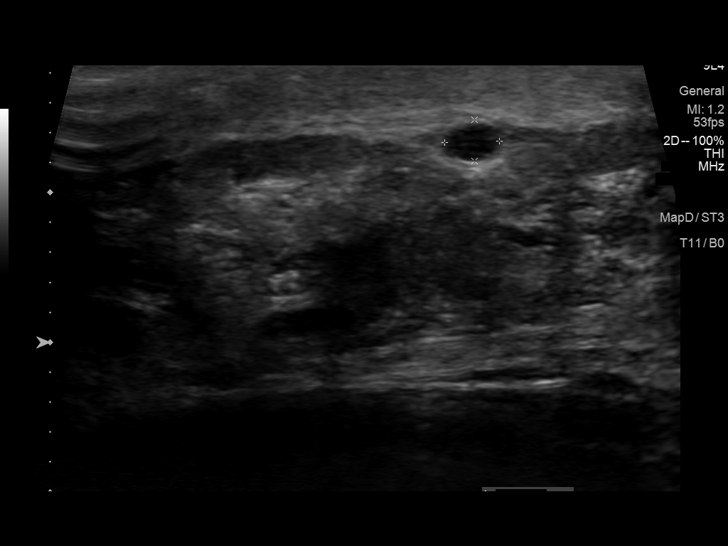
[im 64/70]
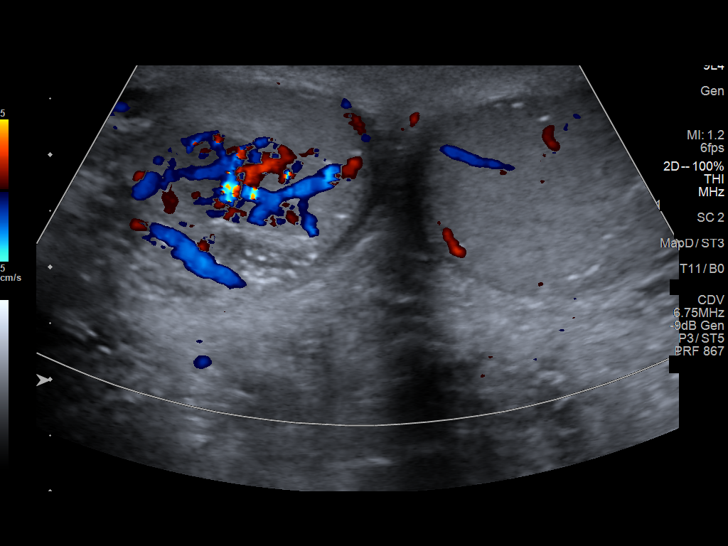
[im 70/70]
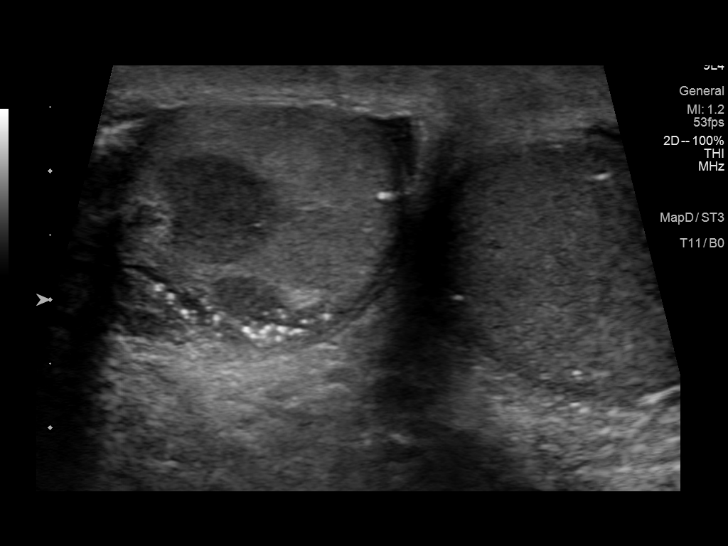

[13 of 25 positions shown; findings below may reference images not displayed]

FINDINGS: Right testicle

Measurements: 3.5 x 1.9 x 2.5 cm. There is a hypoechoic mass in the
right testis which is hypervascular, measuring 2.4 x 1.5 x 2.1 cm.
Several smaller hypoechoic areas are noted throughout the right
testis which also appear hypervascular. There are multiple
calcifications within the right testis.

Left testicle

Measurements: 3.6 x 1.5 x 2.5 cm. No mass evident. There is
microlithiasis in the left testis.

Right epididymis:  Normal in size and appearance.

Left epididymis: There is a cyst at the junction of the body and
tail of the left epididymis measuring 4 x 4 x 3 mm. No evidence of
epididymitis.

Hydrocele:  There is a rather minimal hydrocele on each side.

Varicocele:  None visualized.

Pulsed Doppler interrogation of both testes demonstrates normal low
resistance arterial and venous waveforms bilaterally.
IMPRESSION: 1. Hypervascular, hypoechoic masses within the right testis, largest
measuring 2.4 x 1.5 x 2.1 cm. Suspect multifocal neoplastic lesions
within the right testis given this appearance. Appropriate urologic
consultation advised in this regard.

2. Small calcifications noted in each testis. Current literature
suggests that testicular microlithiasis is not a significant
independent risk factor for development of testicular carcinoma, and
that follow up imaging is not warranted in the absence of other risk
factors. Monthly testicular self-examination and annual physical
exams are considered appropriate surveillance. If patient has other
risk factors for testicular carcinoma, then referral to Urology
should be considered. (Reference: Pham Sai, et al.: A 5-Year Follow
up Study of Asymptomatic Men with Testicular Microlithiasis. J Urol
7442; [id number].)

3. Small left epididymal cyst. No evidence of epididymitis on either
side.

4.  Rather minimal hydrocele on each side.

These results will be called to the ordering clinician or
representative by the Radiologist Assistant, and communication
documented in the PACS or zVision Dashboard.

## 2021-12-30 ENCOUNTER — Encounter: Payer: Self-pay | Admitting: Oncology

## 2022-01-07 ENCOUNTER — Encounter: Payer: Self-pay | Admitting: Oncology

## 2022-01-11 ENCOUNTER — Telehealth: Payer: Self-pay

## 2022-01-11 NOTE — Telephone Encounter (Signed)
CT contrast left at front desk for pick-up on 01/13/22. ?

## 2022-01-14 ENCOUNTER — Inpatient Hospital Stay: Payer: BC Managed Care – PPO | Attending: Oncology

## 2022-01-14 ENCOUNTER — Other Ambulatory Visit: Payer: Self-pay

## 2022-01-14 ENCOUNTER — Ambulatory Visit (HOSPITAL_COMMUNITY)
Admission: RE | Admit: 2022-01-14 | Discharge: 2022-01-14 | Disposition: A | Discharge status: Home / Self Care | Payer: BC Managed Care – PPO | Source: Ambulatory Visit | Attending: Oncology | Admitting: Oncology

## 2022-01-14 DIAGNOSIS — C6291 Malignant neoplasm of right testis, unspecified whether descended or undescended: Secondary | ICD-10-CM

## 2022-01-14 LAB — CBC WITH DIFFERENTIAL (CANCER CENTER ONLY)
Abs Immature Granulocytes: 0.02 10*3/uL (ref 0.00–0.07)
Basophils Absolute: 0 10*3/uL (ref 0.0–0.1)
Basophils Relative: 0 %
Eosinophils Absolute: 0.2 10*3/uL (ref 0.0–0.5)
Eosinophils Relative: 3 %
HCT: 44.3 % (ref 39.0–52.0)
Hemoglobin: 15.1 g/dL (ref 13.0–17.0)
Immature Granulocytes: 0 %
Lymphocytes Relative: 40 %
Lymphs Abs: 2.4 10*3/uL (ref 0.7–4.0)
MCH: 31.7 pg (ref 26.0–34.0)
MCHC: 34.1 g/dL (ref 30.0–36.0)
MCV: 93.1 fL (ref 80.0–100.0)
Monocytes Absolute: 0.6 10*3/uL (ref 0.1–1.0)
Monocytes Relative: 9 %
Neutro Abs: 2.8 10*3/uL (ref 1.7–7.7)
Neutrophils Relative %: 48 %
Platelet Count: 245 10*3/uL (ref 150–400)
RBC: 4.76 MIL/uL (ref 4.22–5.81)
RDW: 11.9 % (ref 11.5–15.5)
WBC Count: 6.1 10*3/uL (ref 4.0–10.5)
nRBC: 0 % (ref 0.0–0.2)

## 2022-01-14 LAB — LACTATE DEHYDROGENASE: LDH: 116 U/L (ref 98–192)

## 2022-01-14 LAB — CMP (CANCER CENTER ONLY)
ALT: 15 U/L (ref 0–44)
AST: 18 U/L (ref 15–41)
Albumin: 5.3 g/dL — ABNORMAL HIGH (ref 3.5–5.0)
Alkaline Phosphatase: 64 U/L (ref 38–126)
Anion gap: 7 (ref 5–15)
BUN: 15 mg/dL (ref 6–20)
CO2: 31 mmol/L (ref 22–32)
Calcium: 10.5 mg/dL — ABNORMAL HIGH (ref 8.9–10.3)
Chloride: 100 mmol/L (ref 98–111)
Creatinine: 1 mg/dL (ref 0.61–1.24)
GFR, Estimated: >60 mL/min (ref >60)
Glucose, Bld: 91 mg/dL (ref 70–99)
Potassium: 4.3 mmol/L (ref 3.5–5.1)
Sodium: 138 mmol/L (ref 135–145)
Total Bilirubin: 0.5 mg/dL (ref 0.3–1.2)
Total Protein: 8.2 g/dL — ABNORMAL HIGH (ref 6.5–8.1)

## 2022-01-14 MED ORDER — IOHEXOL 300 MG/ML  SOLN
100.0000 mL | Freq: Once | INTRAMUSCULAR | Status: AC | PRN
  Administered 2022-01-14: 100 mL via INTRAVENOUS

## 2022-01-14 MED ORDER — SODIUM CHLORIDE (PF) 0.9 % IJ SOLN
INTRAMUSCULAR | Status: AC
  Filled 2022-01-14: qty 50

## 2022-01-15 LAB — AFP TUMOR MARKER: AFP, Serum, Tumor Marker: 2.3 ng/mL (ref 0.0–5.7)

## 2022-01-15 LAB — BETA HCG QUANT (REF LAB): hCG Quant: 1 m[IU]/mL (ref 0–3)

## 2022-01-21 ENCOUNTER — Inpatient Hospital Stay (HOSPITAL_BASED_OUTPATIENT_CLINIC_OR_DEPARTMENT_OTHER): Payer: BC Managed Care – PPO | Admitting: Oncology

## 2022-01-21 DIAGNOSIS — C6291 Malignant neoplasm of right testis, unspecified whether descended or undescended: Secondary | ICD-10-CM

## 2022-01-21 NOTE — Progress Notes (Signed)
Hematology and Oncology Follow Up for Telemedicine Visits ? ?Scott Zimmerman ?[id number] ?07-31-1996 26 y.o. ?01/21/2022 9:54 AM ?Caren Macadam, MDKoberlein, Steele Berg, MD  ? ?I connected with Scott Zimmerman on 01/21/22 at 10:00 AM EDT by telephone visit and verified that I am speaking with the correct person using two identifiers. ?  ?I discussed the limitations, risks, security and privacy concerns of performing an evaluation and management service by telemedicine and the availability of in-person appointments. I also discussed with the patient that there may be a patient responsible charge related to this service. The patient expressed understanding and agreed to proceed. ? ?Other persons participating in the visit and their role in the encounter:  None ? ?Patient's location:  Home ?Provider's location:  Office ? ? ? ?Principle Diagnosis: 26 year-old man with testicular cancer diagnosed in September 2019.  He was found to have stage Ib tumor of the right testis with the 60% embryonal component.  ?  ?  ?Prior Therapy: ?  ?He is status post orchiectomy completed on 07/31/2019 under the care of Dr. Jeffie Pollock.  The pathology showed 2.0 cm with embryonal component at 60%, 30% seminoma and 5% yolk sac. ?  ?BEP chemotherapy started on 09/03/2019.  He received 1 cycle adjuvantly concluded on September 18, 2019. ?  ?Current therapy: Active surveillance. ? ? ? ? ?Interim History: Scott Zimmerman reports doing good without any complications at this time.  He denies any delayed issues related to systemic chemotherapy. ? ? ? ?Medications: I have reviewed the patient's current medications.  ?Current Outpatient Medications  ?Medication Sig Dispense Refill  ? augmented betamethasone dipropionate (DIPROLENE-AF) 0.05 % cream Apply topically 2 (two) times daily. 30 g 0  ? triamcinolone cream (KENALOG) 0.5 % Apply 1 application topically as needed.    ? ?No current facility-administered medications for this visit.  ? ? ? ? ? ?Lab Results: ?Lab  Results  ?Component Value Date  ? WBC 6.1 01/14/2022  ? HGB 15.1 01/14/2022  ? HCT 44.3 01/14/2022  ? MCV 93.1 01/14/2022  ? PLT 245 01/14/2022  ? ?  Chemistry   ?   ?Component Value Date/Time  ? NA 138 01/14/2022 1042  ? K 4.3 01/14/2022 1042  ? CL 100 01/14/2022 1042  ? CO2 31 01/14/2022 1042  ? BUN 15 01/14/2022 1042  ? CREATININE 1.00 01/14/2022 1042  ?    ?Component Value Date/Time  ? CALCIUM 10.5 (H) 01/14/2022 1042  ? ALKPHOS 64 01/14/2022 1042  ? AST 18 01/14/2022 1042  ? ALT 15 01/14/2022 1042  ? BILITOT 0.5 01/14/2022 1042  ?  ? ? ? ?IMPRESSION: ?1. Status post right orchiectomy. ?2. No evidence of lymphadenopathy or metastatic disease in the ?chest, abdomen, or pelvis. ? ? ?Impression and Plan: ? ?26 year old with ? ?1.  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 ?  ?  ?CT scan obtained on January 14, 2022.  Was personally reviewed and discussed with the patient today.  No evidence of metastatic disease noted at this time.  Plan is to continue with active surveillance and repeat imaging studies 1 last time in 2024. ?  ?2.    Pulmonary toxicity surveillance: No issues reported at this time. ?  ?3.  Survivorship issues: He is up-to-date on health maintenance and age-appropriate cancer screening. ?  ?4.  Follow-up: In 12 months for follow-up and repeat evaluation. ? ? ?I discussed the assessment and  treatment plan with the patient. The patient was provided an opportunity to ask questions and all were answered. The patient agreed with the plan and demonstrated an understanding of the instructions. ?  ?The patient was advised to call back or seek an in-person evaluation if the symptoms worsen or if the condition fails to improve as anticipated. ? ?I provided 20 minutes of non face-to-face telephone visit time during this encounter.  The time was spent on reviewing laboratory data, disease status update reviewing imaging studies  and future plan of care discussion. ? ?Zola Button, MD 01/21/2022 9:54 AM ? ?

## 2022-05-24 ENCOUNTER — Other Ambulatory Visit: Payer: Self-pay

## 2022-05-26 ENCOUNTER — Telehealth: Payer: Self-pay | Admitting: Oncology

## 2022-05-26 NOTE — Telephone Encounter (Signed)
Called patient regarding upcoming 2024 appointment, left a voicemail. 

## 2022-09-20 ENCOUNTER — Telehealth: Payer: Self-pay | Admitting: Oncology

## 2022-09-20 NOTE — Telephone Encounter (Signed)
Called patient per Dr. Alen Blew transition. Patient has moved to [place] and does not wish to continue care in [place].

## 2022-12-08 ENCOUNTER — Encounter: Payer: BC Managed Care – PPO | Admitting: Family Medicine

## 2023-01-13 ENCOUNTER — Other Ambulatory Visit: Payer: BC Managed Care – PPO

## 2023-01-20 ENCOUNTER — Ambulatory Visit: Payer: BC Managed Care – PPO | Admitting: Oncology
